# Patient Record
Sex: Female | Born: 1960 | Race: White | Hispanic: No | Marital: Married | Smoking: Never smoker
Health system: Southern US, Community
[De-identification: ages and names within clinical notes are randomized; demographics above are authoritative.]

## PROBLEM LIST (undated history)

## (undated) DIAGNOSIS — G500 Trigeminal neuralgia: Secondary | ICD-10-CM

## (undated) DIAGNOSIS — K859 Acute pancreatitis without necrosis or infection, unspecified: Secondary | ICD-10-CM

## (undated) DIAGNOSIS — R5382 Chronic fatigue, unspecified: Secondary | ICD-10-CM

## (undated) DIAGNOSIS — M797 Fibromyalgia: Secondary | ICD-10-CM

## (undated) HISTORY — PX: ABDOMINAL HYSTERECTOMY: SHX81

---

## 1997-08-07 ENCOUNTER — Emergency Department (HOSPITAL_COMMUNITY): Admission: EM | Admit: 1997-08-07 | Discharge: 1997-08-07 | Payer: Self-pay | Admitting: Emergency Medicine

## 1998-06-14 ENCOUNTER — Emergency Department (HOSPITAL_COMMUNITY): Admission: EM | Admit: 1998-06-14 | Discharge: 1998-06-14 | Payer: Self-pay | Admitting: Emergency Medicine

## 1998-06-20 ENCOUNTER — Encounter: Payer: Self-pay | Admitting: Emergency Medicine

## 1998-06-20 ENCOUNTER — Emergency Department (HOSPITAL_COMMUNITY): Admission: EM | Admit: 1998-06-20 | Discharge: 1998-06-20 | Payer: Self-pay | Admitting: Emergency Medicine

## 1998-06-21 ENCOUNTER — Emergency Department (HOSPITAL_COMMUNITY): Admission: EM | Admit: 1998-06-21 | Discharge: 1998-06-21 | Payer: Self-pay | Admitting: Emergency Medicine

## 1998-06-22 ENCOUNTER — Inpatient Hospital Stay (HOSPITAL_COMMUNITY): Admission: EM | Admit: 1998-06-22 | Discharge: 1998-06-24 | Payer: Self-pay | Admitting: *Deleted

## 1998-07-02 ENCOUNTER — Encounter: Admission: RE | Admit: 1998-07-02 | Discharge: 1998-07-02 | Payer: Self-pay | Admitting: Internal Medicine

## 1998-07-04 ENCOUNTER — Encounter: Admission: RE | Admit: 1998-07-04 | Discharge: 1998-07-04 | Payer: Self-pay | Admitting: Hematology and Oncology

## 1998-07-06 ENCOUNTER — Emergency Department (HOSPITAL_COMMUNITY): Admission: EM | Admit: 1998-07-06 | Discharge: 1998-07-06 | Payer: Self-pay | Admitting: Emergency Medicine

## 1998-07-11 ENCOUNTER — Encounter: Admission: RE | Admit: 1998-07-11 | Discharge: 1998-07-11 | Payer: Self-pay | Admitting: Internal Medicine

## 1998-07-18 ENCOUNTER — Encounter: Admission: RE | Admit: 1998-07-18 | Discharge: 1998-07-18 | Payer: Self-pay | Admitting: Internal Medicine

## 1998-08-15 ENCOUNTER — Encounter: Admission: RE | Admit: 1998-08-15 | Discharge: 1998-08-15 | Payer: Self-pay | Admitting: Internal Medicine

## 1998-10-27 ENCOUNTER — Emergency Department (HOSPITAL_COMMUNITY): Admission: EM | Admit: 1998-10-27 | Discharge: 1998-10-27 | Payer: Self-pay | Admitting: Emergency Medicine

## 1998-10-27 ENCOUNTER — Encounter: Payer: Self-pay | Admitting: Emergency Medicine

## 1998-10-28 ENCOUNTER — Encounter: Payer: Self-pay | Admitting: Emergency Medicine

## 1998-10-28 ENCOUNTER — Emergency Department (HOSPITAL_COMMUNITY): Admission: EM | Admit: 1998-10-28 | Discharge: 1998-10-28 | Payer: Self-pay | Admitting: Emergency Medicine

## 1998-10-28 ENCOUNTER — Ambulatory Visit (HOSPITAL_COMMUNITY): Admission: RE | Admit: 1998-10-28 | Discharge: 1998-10-28 | Payer: Self-pay | Admitting: Emergency Medicine

## 1998-11-25 ENCOUNTER — Encounter: Payer: Self-pay | Admitting: Gastroenterology

## 1998-11-25 ENCOUNTER — Inpatient Hospital Stay (HOSPITAL_COMMUNITY): Admission: EM | Admit: 1998-11-25 | Discharge: 1998-11-26 | Payer: Self-pay | Admitting: Gastroenterology

## 1998-12-03 ENCOUNTER — Encounter: Admission: RE | Admit: 1998-12-03 | Discharge: 1998-12-03 | Payer: Self-pay | Admitting: Hematology and Oncology

## 1998-12-06 ENCOUNTER — Encounter: Admission: RE | Admit: 1998-12-06 | Discharge: 1998-12-06 | Payer: Self-pay | Admitting: Internal Medicine

## 2003-09-24 ENCOUNTER — Emergency Department (HOSPITAL_COMMUNITY): Admission: EM | Admit: 2003-09-24 | Discharge: 2003-09-24 | Payer: Self-pay | Admitting: Family Medicine

## 2004-08-28 ENCOUNTER — Emergency Department (HOSPITAL_COMMUNITY): Admission: EM | Admit: 2004-08-28 | Discharge: 2004-08-28 | Payer: Self-pay | Admitting: Emergency Medicine

## 2004-09-13 ENCOUNTER — Inpatient Hospital Stay (HOSPITAL_COMMUNITY): Admission: EM | Admit: 2004-09-13 | Discharge: 2004-09-17 | Payer: Self-pay | Admitting: Emergency Medicine

## 2004-09-13 ENCOUNTER — Ambulatory Visit: Payer: Self-pay | Admitting: Internal Medicine

## 2004-11-25 ENCOUNTER — Ambulatory Visit: Payer: Self-pay | Admitting: Pain Medicine

## 2005-03-03 ENCOUNTER — Inpatient Hospital Stay (HOSPITAL_COMMUNITY): Admission: EM | Admit: 2005-03-03 | Discharge: 2005-03-04 | Payer: Self-pay | Admitting: *Deleted

## 2005-08-17 ENCOUNTER — Ambulatory Visit: Payer: Self-pay | Admitting: Hospitalist

## 2005-08-17 ENCOUNTER — Inpatient Hospital Stay (HOSPITAL_COMMUNITY): Admission: EM | Admit: 2005-08-17 | Discharge: 2005-08-19 | Payer: Self-pay | Admitting: Emergency Medicine

## 2005-08-17 ENCOUNTER — Ambulatory Visit: Payer: Self-pay | Admitting: Internal Medicine

## 2006-02-09 ENCOUNTER — Emergency Department: Payer: Self-pay | Admitting: Emergency Medicine

## 2006-10-05 ENCOUNTER — Other Ambulatory Visit: Payer: Self-pay

## 2006-10-05 ENCOUNTER — Emergency Department: Payer: Self-pay | Admitting: Emergency Medicine

## 2007-11-20 ENCOUNTER — Emergency Department (HOSPITAL_COMMUNITY): Admission: EM | Admit: 2007-11-20 | Discharge: 2007-11-21 | Payer: Self-pay | Admitting: Emergency Medicine

## 2008-01-05 ENCOUNTER — Emergency Department (HOSPITAL_COMMUNITY): Admission: EM | Admit: 2008-01-05 | Discharge: 2008-01-05 | Payer: Self-pay | Admitting: Family Medicine

## 2008-01-17 ENCOUNTER — Emergency Department (HOSPITAL_COMMUNITY): Admission: EM | Admit: 2008-01-17 | Discharge: 2008-01-17 | Payer: Self-pay | Admitting: Family Medicine

## 2008-09-26 ENCOUNTER — Inpatient Hospital Stay (HOSPITAL_COMMUNITY): Admission: EM | Admit: 2008-09-26 | Discharge: 2008-09-28 | Payer: Self-pay | Admitting: Emergency Medicine

## 2008-09-26 ENCOUNTER — Ambulatory Visit: Payer: Self-pay | Admitting: Family Medicine

## 2010-01-28 ENCOUNTER — Emergency Department (HOSPITAL_COMMUNITY): Admission: EM | Admit: 2010-01-28 | Discharge: 2010-01-28 | Payer: Self-pay | Admitting: Emergency Medicine

## 2010-06-02 IMAGING — US US ABDOMEN COMPLETE
1 series · 14 of 25 positions shown · non-contrast
Comparison: Ultrasound of the abdomen of 11/21/2007

CLINICAL DATA: Abdominal pain, vomiting, history of pancreatitis
previously

COMPLETE ABDOMINAL ULTRASOUND

[Series 1: us abdomen complete · 0.25mm/px · 14 of 60 slices shown]
[im 1/60]
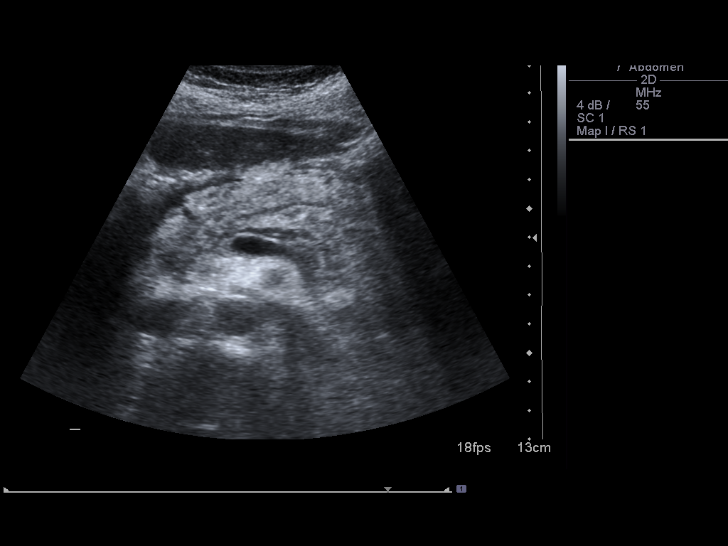
[im 5/60]
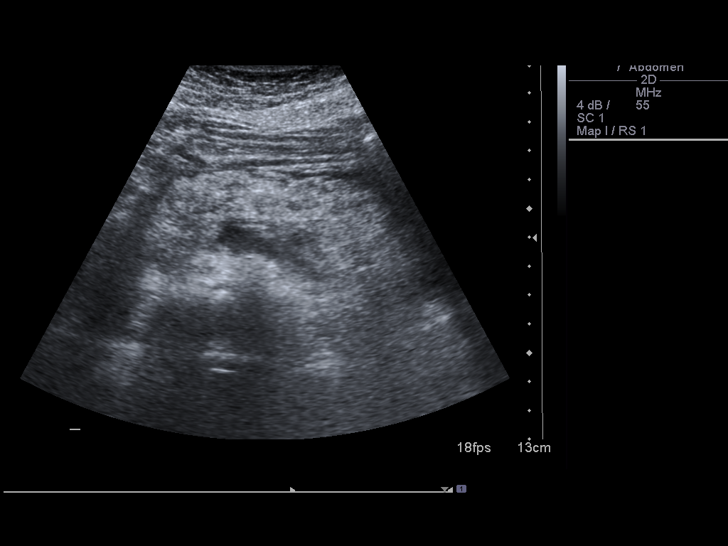
[im 10/60]
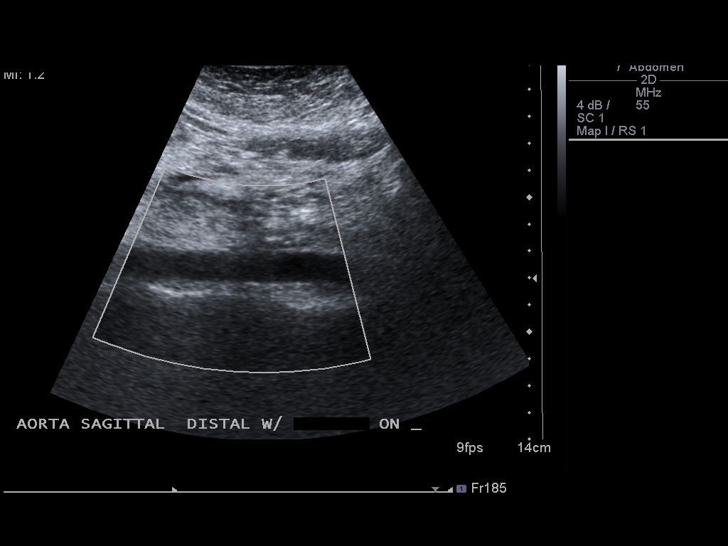
[im 15/60]
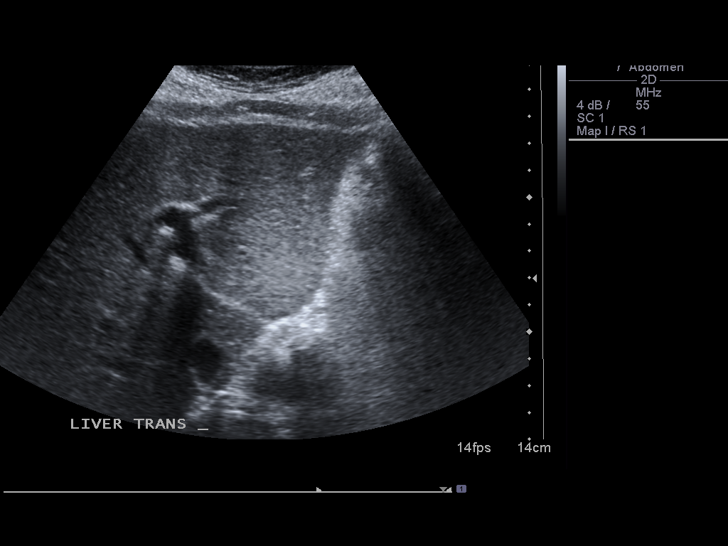
[im 20/60]
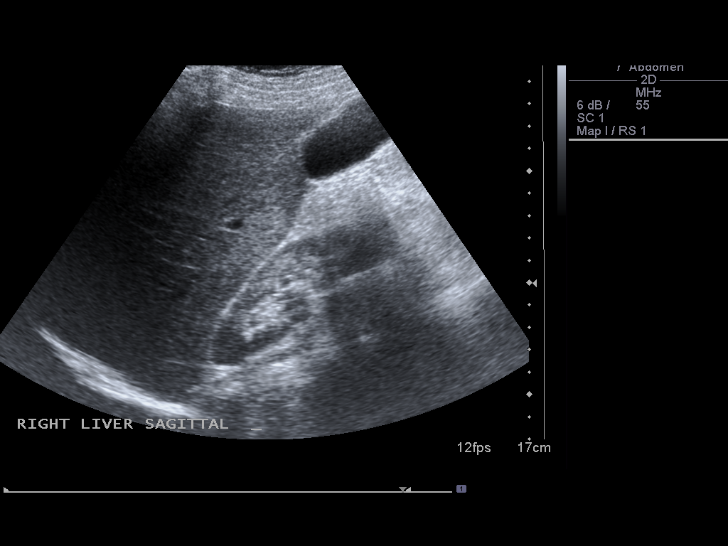
[im 23/60]
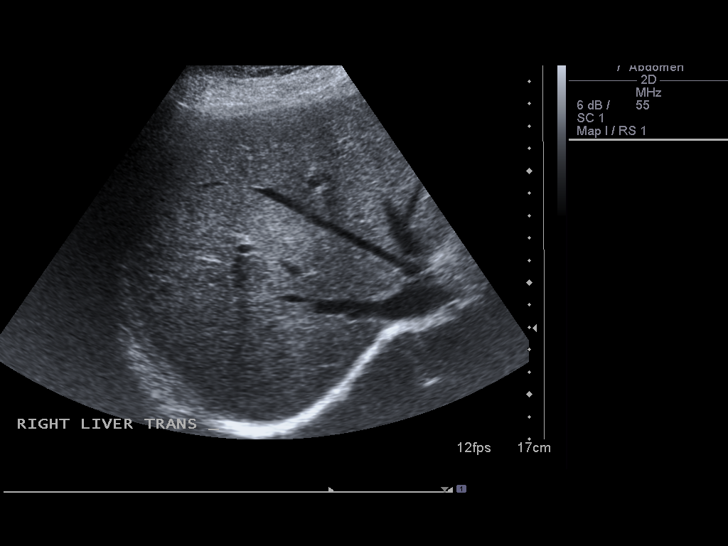
[im 28/60]
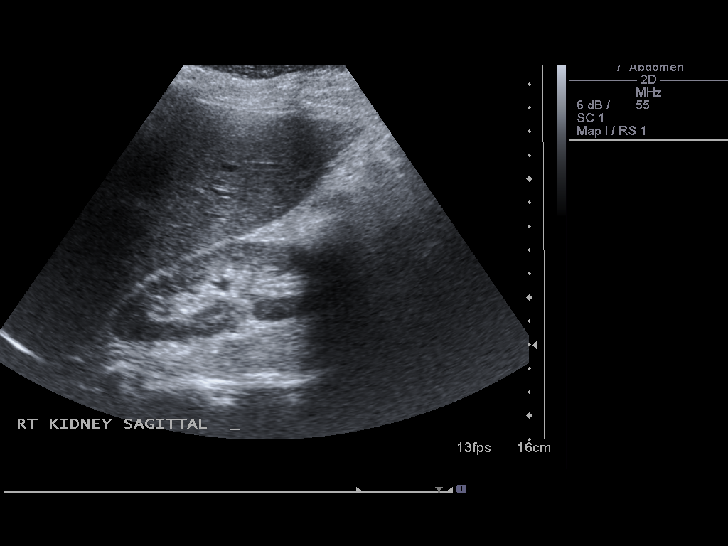
[im 32/60]
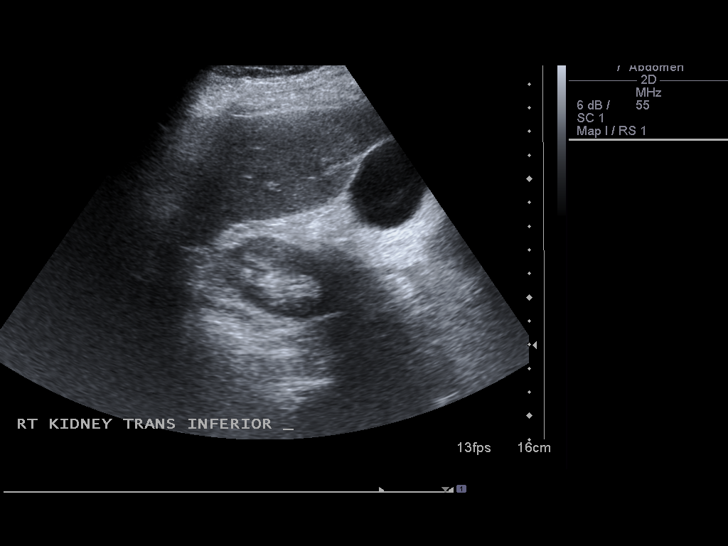
[im 37/60]
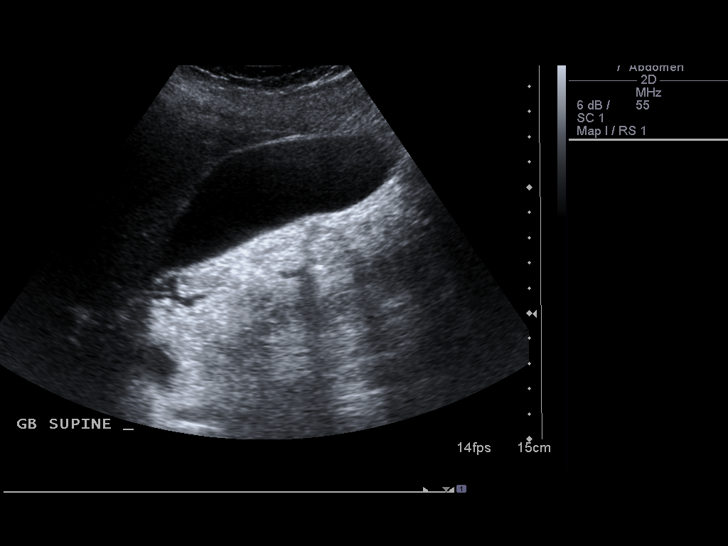
[im 40/60]
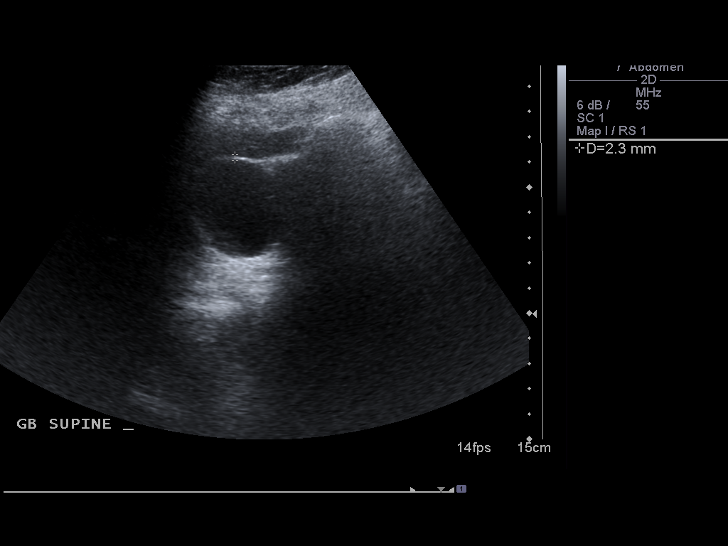
[im 45/60]
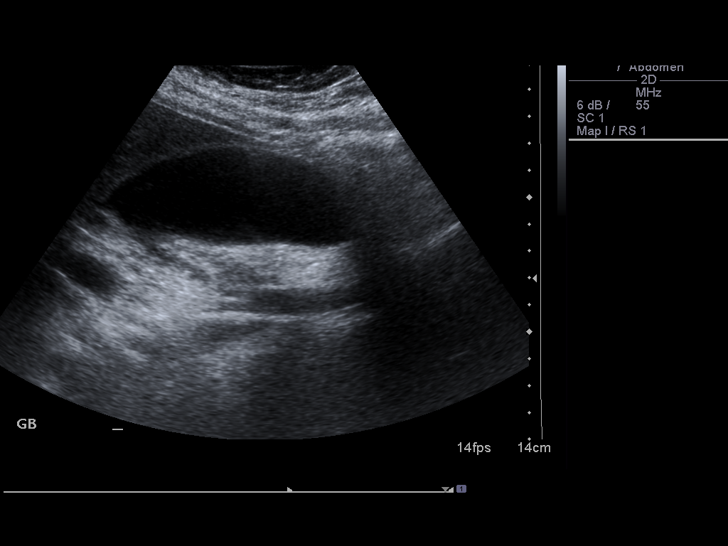
[im 50/60]
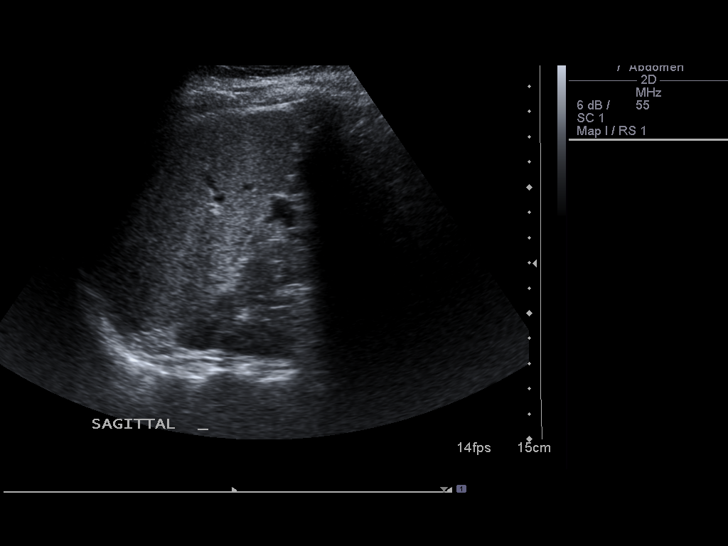
[im 55/60]
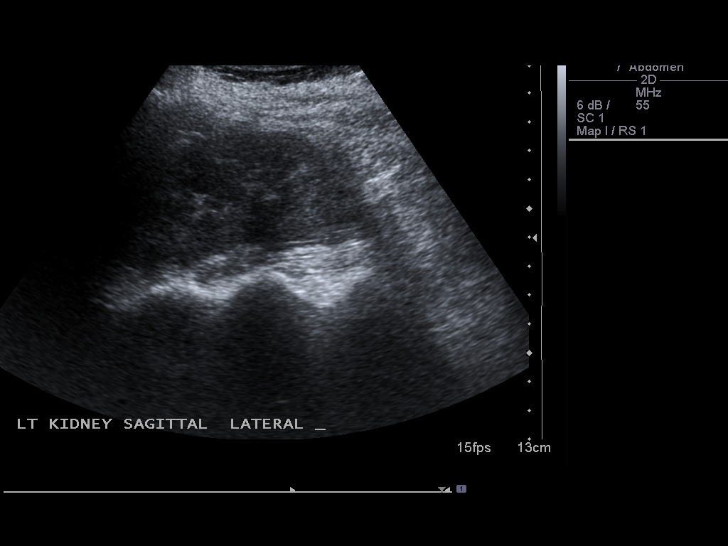
[im 60/60]
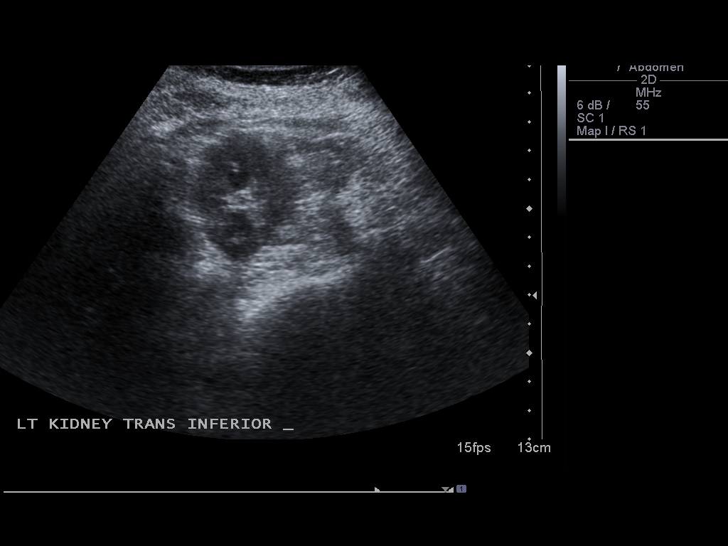

[14 of 25 positions shown; findings below may reference images not displayed]

FINDINGS: Gallbladder:  The gallbladder is well seen and no gallstones are
noted.  No pain is present over the gallbladder upon compression.

Common bile duct:  The common bile duct is normal measuring 4.0 mm
in diameter.

Liver:  The liver has a normal echogenic pattern.  No ductal
dilatation is seen.

IVC:  Visualized.

Pancreas:  The pancreas is diffusely prominent and somewhat
echogenic.  There is pain over the region of the pancreas and early
pancreatitis is a consideration.  No ductal dilatation is seen.

Spleen:  The spleen is normal in size.

Right Kidney:  No hydronephrosis is noted.  The right kidney
measures 10.1 cm sagittally.

Left Kidney:  No hydronephrosis and the left kidney measures
cm.

Abdominal aorta:  The abdominal aorta is normal in caliber.
IMPRESSION: 1.  Prominent echogenic and inhomogeneous pancreas with pain
directly over the pancreas.  Possible early pancreatitis.
2.  No gallstones.  No ductal dilatation.

## 2010-07-28 LAB — CBC
HCT: 32.7 % — ABNORMAL LOW (ref 36.0–46.0)
HCT: 42.4 % (ref 36.0–46.0)
Hemoglobin: 11.3 g/dL — ABNORMAL LOW (ref 12.0–15.0)
MCHC: 34.4 g/dL (ref 30.0–36.0)
Platelets: 219 10*3/uL (ref 150–400)
Platelets: 340 10*3/uL (ref 150–400)
RBC: 3.68 MIL/uL — ABNORMAL LOW (ref 3.87–5.11)
RDW: 12.2 % (ref 11.5–15.5)
WBC: 10.9 10*3/uL — ABNORMAL HIGH (ref 4.0–10.5)
WBC: 6.4 10*3/uL (ref 4.0–10.5)

## 2010-07-28 LAB — COMPREHENSIVE METABOLIC PANEL
BUN: 10 mg/dL (ref 6–23)
Chloride: 104 mEq/L (ref 96–112)
Creatinine, Ser: 0.82 mg/dL (ref 0.4–1.2)
Glucose, Bld: 124 mg/dL — ABNORMAL HIGH (ref 70–99)
Potassium: 3.8 mEq/L (ref 3.5–5.1)
Sodium: 140 mEq/L (ref 135–145)
Total Bilirubin: 0.9 mg/dL (ref 0.3–1.2)
Total Protein: 7.4 g/dL (ref 6.0–8.3)

## 2010-07-28 LAB — BASIC METABOLIC PANEL
BUN: 4 mg/dL — ABNORMAL LOW (ref 6–23)
CO2: 26 mEq/L (ref 19–32)
CO2: 26 mEq/L (ref 19–32)
Calcium: 8.5 mg/dL (ref 8.4–10.5)
Chloride: 106 mEq/L (ref 96–112)
Chloride: 110 mEq/L (ref 96–112)
Creatinine, Ser: 0.73 mg/dL (ref 0.4–1.2)
GFR calc non Af Amer: >60 mL/min (ref >60)
Glucose, Bld: 120 mg/dL — ABNORMAL HIGH (ref 70–99)
Potassium: 3.5 mEq/L (ref 3.5–5.1)
Potassium: 4 mEq/L (ref 3.5–5.1)
Sodium: 141 mEq/L (ref 135–145)

## 2010-07-28 LAB — DIFFERENTIAL
Lymphocytes Relative: 18 % (ref 12–46)
Lymphs Abs: 1.8 10*3/uL (ref 0.7–4.0)
Neutro Abs: 7.6 10*3/uL (ref 1.7–7.7)
Neutrophils Relative %: 74 % (ref 43–77)

## 2010-07-28 LAB — IGG 1, 2, 3, AND 4: IgG Subclass 1: 423 mg/dL (ref 382–929)

## 2010-07-28 LAB — AMYLASE
Amylase: 1647 U/L — ABNORMAL HIGH (ref 27–131)
Amylase: 398 U/L — ABNORMAL HIGH (ref 27–131)

## 2010-07-28 LAB — LIPID PANEL
HDL: 27 mg/dL — ABNORMAL LOW (ref >39)
Total CHOL/HDL Ratio: 8.2 RATIO
Triglycerides: 209 mg/dL — ABNORMAL HIGH (ref <150)
VLDL: 42 mg/dL — ABNORMAL HIGH (ref 0–40)

## 2010-07-28 LAB — LIPASE, BLOOD: Lipase: 628 U/L — ABNORMAL HIGH (ref 11–59)

## 2010-09-02 NOTE — Consult Note (Signed)
NAMEMarland Kitchen  Bartlett, Mia NO.:  [id number]   MEDICAL RECORD NO.:  [id number]          PATIENT TYPE:  INP   LOCATION:  5502                         FACILITY:  [facility]   PHYSICIAN:  Jordan Hawks. Elnoria Howard, MD    DATE OF BIRTH:  November 02, 1960   DATE OF CONSULTATION:  09/27/2008  DATE OF DISCHARGE:                                 CONSULTATION   REFERRED BY:  Teaching Service.   REASON FOR CONSULTATION:  Pancreatitis.   This is an unassigned patient from the Teaching Service.   HISTORY OF PRESENT ILLNESS:  This is a 50 year old female with a past  medical history of acute recurrent pancreatitis, trigeminal neuralgia,  depression, fibromyalgia, peripheral neuropathy, TMJ, and chronic  fatigue, was admitted to the hospital with an acute onset of her  abdominal pain.  The patient states that the pain started without any  provocative factors that is with p.o. intake.  It was quite severe to  the point that she required an evaluation in the emergency room.  When  she presented, she was noted to have marked elevation in her amylase and  lipase and subsequently, she was admitted to the hospital.  Her last  hospitalization for pancreatitis was in November 2006, at that time she  underwent a workup, which was negative for any overt etiology that is  pancreatic divisum, strictures, masses, or stones.  During this  hospitalization, ultrasound of her gallbladder was performed, and there  is no evidence of any biliary ductal dilation or stones or sludge in her  gallbladder.  Subsequently, she does report having a mild episode in  March and at that time, she had some questionable GERD type of symptoms  and subsequently, she presented to the [facility] Emergency Room, but  no hospitalization occurred at that time and she said no obvious  etiology was performed.  She was unsure that that was a subclinical  presentation of her pancreatitis.   PAST MEDICAL HISTORY AND PAST SURGICAL HISTORY:  As  stated above.  Status post hysterectomy and C-sections x3.   FAMILY HISTORY:  Noncontributory.   SOCIAL HISTORY:  Negative for tobacco or illicit drug use.  She uses  alcohol on rare occasions.   REVIEW OF SYSTEMS:  As stated above in history of present illness,  otherwise negative.   MEDICATIONS:  1. Subcu heparin.  2. Protonix 40 mg IV daily.  3. Zofran 4 mg IV q.6 h.  4. Phenergan 25 mg IV q.6 h.  5. Morphine PCA.   ALLERGIES:  DOXYCYCLINE.   PHYSICAL EXAMINATION:  VITAL SIGNS:  Blood pressure is 132/85, heart  rate is 84, respirations 20, and temperature is 100.4.  GENERAL:  The patient is in no acute distress, alert, and oriented.  HEENT:  Normocephalic and atraumatic.  Extraocular muscles intact.  NECK:  Supple with no lymphadenopathy.  LUNGS:  Clear to auscultation bilaterally.  CARDIOVASCULAR:  Regular rate and rhythm.  ABDOMEN:  Flat, soft, and nontender in the epigastric region.  No  rebound or rigidity.  Positive bowel sounds.  EXTREMITIES:  No clubbing, cyanosis, or edema.   LABORATORY  VALUES:  White blood cell count is 10.9, hemoglobin 12.7, MCV  is 89.7, and platelets at 253.  Sodium 138, potassium 4.0, chloride 106,  CO2 of 26, glucose 120, BUN 4, and creatinine 0.7.  Amylase is 1647 and  lipase is 1462.   IMPRESSION:  1. Acute recurrent pancreatitis of unknown etiology.  As previously      stated, extensive workup was performed.  There is no clear evidence      of any etiology.  I do not believe she has chronic pancreatitis as      she does not have any persistent chronic abdominal pain beyond      these flares and she has not had a pancreatic issues for almost 3-      1/2 years.  It is possible that she could have microlithiasis      resulting in gallstone pancreatitis or possibility of sphincter of      Oddi dysfunction. What will be required is an outpatient endoscopic      ultrasound in order to further evaluate the pancreatic parenchyma      to  ensure that there was no evidence of chronic pancreatitis.  This      procedure can only be performed at a minimum of 6 weeks as any type      of acute inflammation will skew the results, and at that time      microlithiasis can also be evaluated.  The patient may require      further evaluation with sphincter of Oddi dysfunction and that can      be performed at [facility].  As of this time, I agree with IV      fluids, although dose should be increased to 200 mL per hour.  2. Preferably, she should remain n.p.o. as there is an elevation in      her amylase and lipase, however, she feels that her pain is      markedly improved at this time, and this issue can be watched  3. Check for autoimmune pancreatitis with an IgG4.      Jordan Hawks Elnoria Howard, MD  Electronically Signed     PDH/MEDQ  D:  09/27/2008  T:  09/28/2008  Job:  [id number]

## 2010-09-02 NOTE — Discharge Summary (Signed)
NAMEMarland Bartlett  [name], [name] NO.:  [id number]   MEDICAL RECORD NO.:  [id number]          PATIENT TYPE:  INP   LOCATION:  5502                         FACILITY:  [facility]   PHYSICIAN:  Leighton Roach McDiarmid, M.D.DATE OF BIRTH:  [birth year]   DATE OF ADMISSION:  09/26/2008  DATE OF DISCHARGE:  09/28/2008                               DISCHARGE SUMMARY   PRIMARY CARE PHYSICIAN:  Josefina Do. Mia Guest, MD   DISCHARGE DIAGNOSES:  1. Acute idiopathic recurrent pancreatitis.  2. Trigeminal neuralgia.  3. Depression.  4. Fibromyalgia.  5. Peripheral neuropathy.  6. Temporomandibular joint disorder.  7. Chronic fatigue.   DISCHARGE MEDICATIONS:  1. Percocet 5/325 one to two tablets every 4-6 hours as needed for      pain.  2. Estradiol as directed by primary care physician.   DISCONTINUED MEDICATIONS:  The patient stated she had been on Cymbalta  and Lyrica previously but had recently stopped them prior to  presentation on the emergency department.   CONSULTANT:  Jordan Hawks. Elnoria Howard, MD, Gastroenterology   PROCEDURES:  Abdominal ultrasound on September 26, 2008 shows a prominent  echogenic and inhomogeneous pancreas with pain directly over the  pancreas.  Possible early pancreatitis.  No gallstones or ductal  dilatation seen.   LABORATORY STUDIES:  1. CBC on admission showed white blood count 10.2, hemoglobin 14.6,      and platelets 340.  2. Metabolic panel on admission showed:  Sodium 140, potassium 3.8,      chloride 104, bicarb 26, glucose 124, BUN 10, and creatinine 0.82.      Alkaline phosphatase 122, AST 23, ALT 14, total protein 7.4,      albumin 4.0, and calcium 9.6.  3. Lipase 628 on admission and 1462 on day after admission.  4. Amylase 398 on admission and 1647 on day after admission.  5. Lipid profile shows total cholesterol 222, triglycerides 209, HDL      27, and LDL 153.   BRIEF HOSPITAL COURSE:  This is a 50 year old female with a past medical  history significant  for recurrent acute episodes of pancreatitis who  presented to the ED with abdominal pain and nonbloody, nonbilious emesis  x1 day.  1. Acute pancreatitis.  The patient has a Ranson score of zero on      admission.  Physical exam, laboratory studies were consistent with      acute pancreatitis.  The patient has had several previous      admissions for pancreatitis.  Her last one being in 2006, during      which time extensive workup showed no etiology for pancreatitis.      Ultrasound was repeated on this hospitalization which showed no      gallbladder pathology and pancreatic changes consistent with      pancreatitis.  Dr. Elnoria Howard was consulted who did not feel that her      history was suggestive of chronic pancreatitis.  He suggested that      she follow up in 6 weeks after this acute episode had resolved to      further evaluate pancreatic parenchyma  by endoscopic ultrasound and      possible further evaluation for sphincter of Oddi dysfunction.      Labs were drawn for autoimmune pancreatitis.  At time of discharge,      IgG4 was still pending.   The patient was admitted on supportive therapy with IV fluids at 200  mL/hour, n.p.o.  The patient was switched to a morphine PCA for better  pain control.  The patient tolerated clears well and the next day  quickly advanced to a low-fat regular diet with good pain control on  p.o. Percocet.  1. Depression/fibromyalgia/peripheral neuropathy.  The patient states      she is not currently on medications for any of these conditions.      She showed no acute changes and was asked to follow up with her PCP      for further needs.   DISCHARGE INSTRUCTIONS:  The patient was instructed to advance diet  slowly as tolerated other than cautions to observe a low-fat diet.   FOLLOWUP:  The patient will follow up in 1-2 weeks with Dr. Jorene Bartlett and  was given information to follow with Dr. Elnoria Howard in 6 weeks.      Delbert Harness, MD  Electronically  Signed      Leighton Roach McDiarmid, M.D.  Electronically Signed    KB/MEDQ  D:  09/28/2008  T:  09/29/2008  Job:  [id number]   cc:   Josefina Do. Mia Bartlett, M.D.

## 2010-09-02 NOTE — H&P (Signed)
NAMEMarland Kitchen  Mia Bartlett, Mia Bartlett NO.:  [id number]   MEDICAL RECORD NO.:  [id number]          PATIENT TYPE:  INP   LOCATION:  5502                         FACILITY:  [facility]   PHYSICIAN:  Leighton Roach McDiarmid, M.D.DATE OF BIRTH:  [birth year]   DATE OF ADMISSION:  09/26/2008  DATE OF DISCHARGE:                              HISTORY & PHYSICAL   PRIMARY CARE PHYSICIAN:  Dr. Jorene Guest in [place].   CHIEF COMPLAINT:  Pancreatitis.   HISTORY OF PRESENT ILLNESS:  This is a 50 year old female with a history  of recurrent and questionable chronic pancreatitis here with abdominal  pain.  She has severe pain starting this morning.  She has vomited  several times today.  There is no blood in her vomit.  Her pain is in  the epigastric area and not relieved by fentanyl or Dilaudid that she  has received in the emergency room.  Morphine has helped in the past.  She denies fevers and dysuria.  She had beem feeling well until this  morning except for some increased indigestion.  In the past, there has  been no known etiology for pancreatitis.  However, she did not follow up  with Dr. Elnoria Howard, our GI doctor, for further workup.  The pain is currently  burning pain.   PAST MEDICAL HISTORY:  1. Chronic pancreatitis.  2. Trigeminal neuralgia.  3. Depression.  4. Fibromyalgia.  5. Peripheral neuropathy.  6. TMJ.  7. Chronic fatigue.   MEDICATIONS:  1. Estradiol unknown dose.  2. Percocet 10/650 four to five tablets daily.  She recently stopped Cymbalta and Lyrica.  In the emergency room, she has received 2 mg of Dilaudid, as well as 100  mcg of fentanyl.   ALLERGIES:  DOXYCYCLINE causes rash.   SOCIAL HISTORY:  She denies tobacco, denies drugs.  She uses alcohol on  rare occasions.  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   FAMILY HISTORY:  Hypertension and diabetes in her mother.  Father had MI  at 92.  There is no known GI or pancreatitis in her family.   PAST SURGICAL HISTORY:   Hysterectomy and 3 C-sections.   REVIEW OF SYSTEMS:  As in the HPI, as well as denies dysuria, denies  shortness of breath, denies chest pain, denies diarrhea, denies  vomiting, denies hematochezia, denies edema.   PHYSICAL EXAMINATION:  VITAL SIGNS:  Currently, temperature is 97.3,  heart rate 77, respiratory rate 22, blood pressure 149/87, O2 sats are  100 on room air.  GENERAL:  Not in acute distress.  She is holding her epigastric area.  HEENT:  Moist mucous membranes.  Poor dentition.  No erythema in throat.  Pupils equal, round, and reactive to light and accommodation.  Extraocular muscles are intact.  There are no lesions in her nares.  NECK:  No masses.  CARDIOVASCULAR:  Regular rate and rhythm.  No rubs, gallops, or murmurs.  Normal radial and DP pulses.  PULMONARY:  Clear to auscultation bilaterally.  ABDOMEN:  Soft, diffuse tenderness to palpation in the mid and upper  areas, worse in epigastric area.  She has right  upper quadrant  tenderness to palpation and positive McBurney sign.  She has guarding,  however, no rebound.  EXTREMITIES:  Full range of movement.  No edema.  SKIN:  She has got dry skin on the plantar surface of her feet.  No  sores.  She has a brisk cap refill.  NEUROLOGIC:  Oriented x3.  She has got normal deep tendon reflexes that  equal and no clonus.   LABORATORY DATA:  White count 10.2, hemoglobin 14.6, hematocrit 42.4,  platelets 340, neutrophil count of 7.6.  Lipase is elevated at 628.  Sodium 140, potassium 3.8, chloride 104, bicarb 26, BUN 10, creatinine  0.86, glucose 124.  Bilirubin 0.9, alk phos is elevated at 122, AST 23,  ALT 14, total protein 7.4, calcium 9.6, albumin 4.   ASSESSMENT:  A 50 year old female with pancreatitis, acute and  questionable on chronic.  1. Pancreatitis:  Mild with Ranson score of 0 currently, which      indicates good prognosis and low mortality.  We need to check her      hematocrit, BUN, bicarb, calcium, and  fluids to calculate her total      score in 48 hours.  Her lipase is currently elevated compared to      her recent visits, however, not as high as it has been in the past.      Her baseline lipase maybe elevated as in her history she has a      chronic pancreatitis.  We are going to obtain an abdominal      ultrasound to assess for gallbladder disease and to evaluate her      pancreas.  If there are signs of cirrhosis or worsening clinical      status, we will obtain a CT of the abdomen.  We will go ahead and      check an amylase now.  Repeat her labs in the morning.  We will      also add on a fasting lipid panel to assess for elevated      triglycerides.  In rare cases, estrogen can cause pancreatitis, so      we will discontinue this at this point.  In the past, there is no      known etiology for her pancreatitis and this may be the case again.      Consider checking an HIV.  Alcohol is does not appear to be the      cause.  2. Pain:  In the past, she has responded to morphine.  She is willing      to try p.r.n. IV meds at first; however, she may benefit from a PCA      pump in the future.  At that point, we will need to calculate her      basal based on her advanced p.r.n. need.  3. Fluids:  Needs fluid resuscitation.  She was given 1 liter in the      emergency room, and I will now bolus her 1 more liter and then put      her on a D5 normal saline at 200 mL/hour.  We can increase this if      needed.  4. Gastrointestinal:  N.p.o. due to pain.  Once decreased pain, she      can eat as tolerated since she has no infection and is not severely      ill.  5. Depression:  Not on meds.  6. Trigeminal neurologia:  Not  on meds, although recently stopped      Cymbalta and Lyrica.  7. Prophylaxis:  PPI and heparin.   DISPOSITION:  She needs pain management and fluid resuscitation likely  in 1-2 days if she improves although longer if she deteriorates.      Johney Maine, M.D.   Electronically Signed      Leighton Roach McDiarmid, M.D.  Electronically Signed    JT/MEDQ  D:  09/26/2008  T:  09/27/2008  Job:  [id number]

## 2010-09-05 NOTE — Discharge Summary (Signed)
NAMEMarland Bartlett  MADEEHA, COSTANTINO NO.:  [id number]   MEDICAL RECORD NO.:  [id number]          PATIENT TYPE:  INP   LOCATION:  5712                         FACILITY:  [facility]   PHYSICIAN:  Hillery Aldo, M.D.   DATE OF BIRTH:  [birth year]   DATE OF ADMISSION:  09/13/2004  DATE OF DISCHARGE:  09/17/2004                                 DISCHARGE SUMMARY   DISCHARGE DIAGNOSES:  1. Acute pancreatitis of unknown etiology.  2. Sinusitis.  3. Anemia.  4. Diffuse body aches.  5. Hysterectomy with C-section.  6. Trigeminal neuralgia.  7. History of shingles.     DISCHARGE MEDICATIONS:  1. Percocet 5/325 mg q.6h. p.r.n. for pain.  2. Phenergan 25 mg q.6h. p.r.n. for nausea.  3. Neurontin 600 mg t.i.d.  4. Pancrease.     CONSULTATIONS DURING HOSPITALIZATION:  GI consult from Dr. Elnoria Howard.   DISPOSITION:  The patient is to be discharged home.  She is to follow up  with her PCP, Dr. Vear Clock in [place], [place].   BRIEF ADMISSION HISTORY AND PHYSICAL:  Ms. Mia Bartlett is a 50 year old white  female with a past medical history significant for multiple bouts of  pancreatitis with no known etiology.  The patient was last hospitalized in  2000 with negative workup.  The patient awoke the night before admission  with sharp abdominal pain, epigastric and radiating in a band around her  right upper quadrant to the back.  She also had associated chest pain that  worsened with movement, deep breathing, and mild shortness of breath.  She  related two episodes of nonbilious, nonbloody emesis.  She also stated that  the pain felt like her pancreatitis flares in the past.  She denied any  fevers, chills, or melena.  She had recently started on Tegretol for  trigeminal neuralgia.   PHYSICAL EXAMINATION ON ADMISSION:  VITAL SIGNS:  Pulse 65, blood pressure  104/61, temperature 98.6, respirations 18, O2 saturation 99% on room air.  GENERAL:  She appeared in mild discomfort, alert and  oriented x3.  HEENT:  Eyes were PERRLA, no icterus, extraocular movements intact.  Her  oropharynx was clear.  NECK:  Soft, supple, no thyromegaly, no lymphadenopathy.  LUNGS:  Clear to auscultation bilaterally.  She had a regular rate and  rhythm, no murmurs, rubs, or gallops.  ABDOMEN:  Soft with moderate epigastric tenderness and right upper quadrant  tenderness.  She had some involuntary guarding, no rebound, no  hepatosplenomegaly.  EXTREMITIES:  Showed no clubbing, cyanosis, or edema.  SKIN:  No rash.  She had no lymphadenopathy.  MUSCULOSKELETAL:  Grossly intact.  She had no focal neurological deficits.   STUDIES:  EKG showed no ischemia.  Acute abdominal series showed no acute  disease in the chest, normal bowel gas pattern.   LABORATORY DATA:  Sodium 136, potassium 4.2, chloride 104, bicarb 27, BUN  11, creatinine 0.9, glucose 101.  Anion gap was 5, bilirubin 0.5, alkaline  phosphatase 54.  SGOT 19, SGPT 18.  Protein 6.3, albumin 3.6, calcium 8.5,  lipase 731, amylase 721.  White count was 13.9, hemoglobin 11.9,  platelets  373,000.  ANC was 11.7.   PROCEDURES PERFORMED DURING HOSPITALIZATION:  (1)  Ultrasound of the abdomen  showed a small amount of fluid identified around the tail of the pancreas  and spleen of indeterminate etiology.  There is mild right-sided  hydronephrosis.  There is no evidence of cholelithiasis or biliary duct  dilatation.  (2) CT of the abdomen and pelvis demonstrated acute  pancreatitis with enlarged pancreatic tail with extensive peripancreatic  fluid tracking the medial aspect of the spleen extending along the lateral  __________ .  The pelvis demonstrated minimal free peritoneal fluid, absence  of the uterus and ovaries, no adnexal masses, minimal free peritoneal fluid.  Plain films of the spine showed some disk space narrowing at L5-S1.   HOSPITAL COURSE:  Problem 1. Pancreatitis.  The patient was admitted to a  regular floor bed for  monitoring, was started on IV morphine and eventually  a PCA pump with morphine for pain control.  Was given Zofran for nausea as  needed and also made NPO.  On the second day of admission, the patient was  transitioned to liquids, was tolerating this well, and on the third day of  admission was advanced to a BRAT diet, and on the day of discharge was  tolerating a BRAT diet.  In GI consultation, Dr. Elnoria Howard stated the patient was  appropriate for discharge and recommended to discharge the patient with  Percocet 5/325 mg as needed for pain, to give the patient a trial of non-  enteric coated pancreatic lipase, recommended a followup MRCP as an  outpatient, and also to consider the patient for a cholecystectomy as a  possible source of the pancreatitis.  Might be due to idiopathic  microlithiasis from the gallbladder.  If the pancreatitis recurs in the  absence of a gallbladder, an ERCP with sphincterotomy might be considered.  A fasting lipid profile was checked for possible etiology.  Total  cholesterol was 174, LDL 108, triglycerides 164, HDL 33.  The patient denies  heavy alcohol consumption and there is no evidence of gallstones on any  imaging.   Problem 2. Anemia.  Stable hemoglobin, likely chronic.  No workup was done  since the patient's hemoglobin remained stable around 11.8.   Problem 3. Sinusitis.  The patient was started on Allegra and Afrin and will  be discharged with those.   Problem 4. Diffuse body aches, trigeminal neuralgia.  The patient will be  discontinued on Neurontin 600 mg.  The patient wishes to be worked up for MS  and recommended that she follow up with her PCP for an outpatient MRI.   LABORATORY DATA ON DISCHARGE:  White count 4.3, hemoglobin 11.8, platelets  326,000.  Sodium 139, potassium 4.0, chloride 106, bicarb 28, BUN 3,  creatinine 0.8, glucose 98.      SD/MEDQ  D:  09/17/2004  T:  09/17/2004  Job:  [id number]   cc:   Loma Sender [id number]   [place]   [address]  Fax: [phone]

## 2010-09-05 NOTE — Discharge Summary (Signed)
NAMEMarland Bartlett  MAHLANI, BERNINGER NO.:  [id number]   MEDICAL RECORD NO.:  [id number]          PATIENT TYPE:  INP   LOCATION:  6703                         FACILITY:  [facility]   PHYSICIAN:  Eliseo Gum, M.D.   DATE OF BIRTH:  1961/03/12   DATE OF ADMISSION:  08/17/2005  DATE OF DISCHARGE:  08/19/2005                                 DISCHARGE SUMMARY   DISCHARGE DIAGNOSES:  1.  Acute recurrent pancreatitis, cause unknown.  2.  Chronic pancreatitis.  3.  Trigeminal neuralgia.  4.  Depression.  5.  Fibromyalgia.  6.  Peripheral neuropathy.   DISCHARGE MEDICATIONS:  1.  Neurontin 1200 mg p.o. b.i.d.  2.  Percocet one to two tablet p.o. q.4h. p.r.n. pain, and I dispensed #40.   CONDITION:  Stable.  She was able to tolerate full liquid diet on discharge  and was ready to go home.  She did have a little bit of abdominal pain but  she is instructed to follow up with her primary care doctor and a GI doctor  within the next week or two.   PROCEDURES:  On August 17, 2005, she had a CT of her abdomen and pelvis  showing extensive peripancreatic fluid compatible with acute pancreatitis  but no pseudocyst and a small amount of pelvic ascites.  On Aug 18, 2005, she  had an abdominal ultrasound showing no evidence of gallstones or acute  cholecystitis, enlarged heterogeneous pancreas consistent with history of  pancreatitis, and a small amount of ascites.   HISTORY AND PHYSICAL:  For full H&P please consult the chart but in brief,  Mia Bartlett is a 50 year old Caucasian woman with a history of recurrent  pancreatitis of no known cause who presented with a chief complaint of  severe abdominal pain located especially in the right upper quadrant that  first started in the morning.  She did endorse that she has been having pain  for weeks but it was not as sharp as it was when she woke up this morning.  It awoke her from her sleep and was 10/10 pain.  She also complained of  anorexia,  nausea and vomiting, and the pain was aggravated by certain  positions and palpation.  She denies any alcohol abuse, her triglycerides  are normal, and she has no evidence of gallbladder disease.  Her gallbladder  was evaluated 2 weeks prior to her presentation at the emergency department  and showed no evidence of gallbladder disease.  She was only taking  Neurontin and estrogen on presentation but the timing of when she started  taking estrogen did not coincide with the timing of when she began to have  her episodes of pancreatitis which was in the late 1990s.  Physical exam:  Vital signs were stable.  In general, she is alert and oriented in acute  distress but well hydrated.  Heart regular rate and rhythm, no murmurs,  rubs, or gallops.  Lungs clear to auscultation.  Abdomen soft, nondistended,  no guarding and no rebound tenderness but hypoactive bowel sounds and very  tender abdomen diffusely.  She was tearful and complaining  of severe pain.  Extremities normal, no abnormalities.  Neurologic:  She is alert and  oriented, no focal deficits.  Skin normal color, no rashes.  Psychiatric:  She was alert and oriented.  In the emergency room she got narcotics and  antiemetics and was admitted.  Her CBC showed leukocytosis with a white cell  count of 13.7 and ANC of 11.8.  Her amylase was 327, her lipase was 356, but  her LFTs and electrolytes were normal.  Her urinalysis was also negative.  She was admitted for supportive treatment.   HOSPITAL COURSE:  #1 - ABDOMINAL PAIN WHICH WAS DEEMED TO BE ACUTE  PANCREATITIS.  She was admitted and the CT was followed up by an ultrasound  to reevaluate her gallbladder and that was, of course, negative.  She was  treated with narcotics and required a Dilaudid pump due to severe pain,  which was soon switched to IV Dilaudid then morphine over the next 2 days.  She was held n.p.o. but then allowed to take her Neurontin which she says  keeps her headaches  away.  She tolerated full liquids on the third day of  admission.  She had another fasting lipid panel which was done and LDL was  175, triglycerides were 191, and total cholesterol 242, so the slightly  elevated triglycerides were not enough to explain her acute pancreatitis.   #2 - TRIGEMINAL NEURALGIA.  She was complaining severely of headache by the  second day of admission so we restarted her Neurontin which we allowed her  to take p.o.  She tolerated this well and the next day her diet was  advanced.  She says that Neurontin is the only thing that helps her for  pain.  She was discharged with a prescription for her Percocet to treat any  residual tenderness or pain in her stomach.  She got 40 pills and is  instructed to return to her primary care doctor, Dr. Elnoria Howard, for further  prescriptions if she should need any.  She is instructed to continue on her  Neurontin but was told to discontinue her estrogen since she had been off of  it for a week and had no symptoms.  She is to follow up with Dr. Elnoria Howard and  her primary care doctor, Dr. Lacie Scotts, and will be calling them for  appointments.      Clearance Coots, M.D.      Eliseo Gum, M.D.  Electronically Signed    IN/MEDQ  D:  08/19/2005  T:  08/20/2005  Job:  [id number]   cc:   Jordan Hawks. Elnoria Howard, MD  Fax: [phone]   Evelene Croon  Fax: [phone]

## 2010-09-05 NOTE — H&P (Signed)
NAMEMarland Bartlett  CHERYLEE, RAWLINSON NO.:  [id number]   MEDICAL RECORD NO.:  [id number]          PATIENT TYPE:  EMS   LOCATION:  ED                           FACILITY:  [facility]   PHYSICIAN:  Sherin Quarry, MD      DATE OF BIRTH:  Apr 25, 1960   DATE OF ADMISSION:  03/02/2005  DATE OF DISCHARGE:                                HISTORY & PHYSICAL   Mia Bartlett is a 50 year old lady, who has a history of chronic  recurrent acute pancreatitis that dates back to 1998.  She states that she  has episodes about once a month which can usually be aborted by drinking  Pedialyte and not eating any solid food for awhile.  Her last severe episode  of pancreatitis was in May of this year.  At that time, her symptoms  responded over a 4 day period to intravenous fluid, bowel rest, and pain  medications.  She states that over this entire period, she really has had  very little evaluation of the etiology of the pancreatitis.  She does recall  that in 2003, she had upper endoscopy and colonoscopy done at [facility] which was  diagnostically unrevealing.  During her last hospitalization, she was seen  by Dr. Elnoria Howard, who suggested that perhaps the patient had a very subtle  gallbladder problem or perhaps she had a sphincter of Oddi dysfunction and  might need evaluation at [facility] or [facility].  The patient's ability to have  further evaluation of her problem has been very limited by her lack of  insurance.  Mia Bartlett indicates that yesterday she began to experience  severe right upper quadrant discomfort which did not respond to drinking  Pedialyte.  She had 1 episode of vomiting this morning.  Pain seems to  radiate through to the back and is associated with malaise, difficulty  sleeping, and diaphoresis.  She presented to the [facility] Emergency Room.  Her blood pressure was 121/80.  She was administered Dilaudid 1 mg which  partially relieved her pain.  Initial laboratory studies included a white  count 9800.  Electrolytes were within normal limits.  Liver functions were  normal.  Lipase was greater than 2000.  Ms. Welles is admitted at this  time for presumed flare of acute pancreatitis.   PAST MEDICAL HISTORY:   MEDICATIONS:  1.  Celebrex 200 mg daily.  2.  __________ 8 mg at bedtime p.r.n. sleep.  3.  Cymbalta 60 mg daily.  4.  Xanax 0.5 mg p.r.n. for anxiety.  5.  Neurontin 600 mg t.i.d.  6.  Estropipate 1.5 mg daily.  7.  Prilosec 20 mg daily.  8.  Percocet 5/325, 1-2 q.4-6h. p.r.n. pain.   ALLERGIES:  She is allergic to DOXYCYCLINE.   OPERATIONS:  She has had a previous hysterectomy and 3 C-sections.   FAMILY HISTORY:  There is a significant history of diabetes and hypertension  in the family.   SOCIAL HISTORY:  The patient says that she will drink 1 alcoholic beverage  about every other month and definitely has not been consuming any alcohol  recently.  She does not smoke.  She does not use any illicit drugs.  She is  married.   REVIEW OF SYSTEMS:  HEAD:  The patient states that she has chronic  trigeminal neuralgia and for this reason, requires chronic pain medication.  CHEST:  She denies coughing, wheezing, or chest congestion.  CARDIOVASCULAR:  Denies orthopnea, PND, or ankle edema.  GI:  See above.  There has been no  hematemesis, no melena.  Bowel function has been normal.  GU:  Denies  dysuria or urinary frequency.  NEUROLOGIC:  There is no history of seizure  or stroke.  ENDO:  Denies excess thirst, urinary frequency, or nocturia.   PHYSICAL EXAMINATION:  VITAL SIGNS:  Her temperature is 97.1, blood pressure  121/80.  Pulse is 80, respirations 20, O2 saturation 98%.  HEENT:  Within normal limits.  CHEST:  Clear.  BACK:  No CVA or point tenderness.  CARDIOVASCULAR:  Normal S1 and S2 without rubs, murmurs, or gallops.  ABDOMEN:  Remarkable for moderate right upper quadrant tenderness.  There is  no guarding or rebound.  No masses are appreciated.   Bowel sounds are  present.  NEUROLOGIC:  Cranial nerves, motor, sensory, and cerebellar testing is  normal.  EXTREMITIES:  No evidence of cyanosis or edema.   IMPRESSION:  1.  Acute pancreatitis, chronic/recurrent.  2.  History of trigeminal neuralgia.  3.  History of depression.  4.  Chronic pain.  5.  Status post hysterectomy.   PLAN:  We will make the patient NPO.  We will give her intravenous fluids.  In the past, a PCA pump has been the best thing to use for relieving her  pain.  We will get an abdominal ultrasound, follow her electrolytes, and  continue her proton pump inhibitor.  I think this patient really needs to  see a gastroenterologist for further follow up of her pancreatitis.  Possibly, the best way to accomplish this is going to be to refer her to an  academic institution.  We will discuss this further.  I broached the subject  with the patient.           ______________________________  Sherin Quarry, MD     SY/MEDQ  D:  03/02/2005  T:  03/02/2005  Job:  [id number]   cc:   Dr. Delfino Lovett  [place]   Dr. Cheryln [employer]  [place]   Jordan Hawks. Elnoria Howard, MD  Fax: [phone]

## 2010-09-05 NOTE — Discharge Summary (Signed)
Mia Bartlett, Mia Bartlett             ACCOUNT NO.:  [id number]   MEDICAL RECORD NO.:  [id number]          PATIENT TYPE:  INP   LOCATION:  1613                         FACILITY:  [facility]   PHYSICIAN:  Melissa L. Ladona Ridgel, MD  DATE OF BIRTH:  1960/05/21   DATE OF ADMISSION:  03/02/2005  DATE OF DISCHARGE:  03/04/2005                                 DISCHARGE SUMMARY   DISCHARGE DIAGNOSES:  1.  Acute recurrent pancreatitis.  The patient was admitted to the hospital,      hydrated with IV fluids and treated with pain medications.  She was kept      on bowel rest.  Her symptoms seem to have abated; however, her enzymes      remain significantly elevated.  She underwent MRCP which will be      followed up by Dr. Jeani Hawking in the outpatient setting.  Plan is to      continue her pancreas enzymes with meals and discharge her with adequate      oral pain medication and have her follow up with Dr. Elnoria Howard in two weeks.  2.  Fibromyalgia.  The patient will resume her Neurontin and Cymbalta.  3.  Sinus tachycardia.  This appears to be improving with good decrease in      her pain.  4.  Trigeminal neuralgia.  Will resume her Neurontin and Cymbalta which also      helps with treating this.  5.  Depression.  Cymbalta will be resumed.   DISCHARGE MEDICATIONS:  1.  Neurontin 600 mg p.o. t.i.d.  2.  Xanax 1 mg 1/2 to 1 tablet p.o. daily p.r.n.  3.  Celebrex 200 mg p.o. daily.  4.  Estropipate 1.5 mg p.o. daily.  5.  Cymbalta 60 mg p.o. daily.  6.  Pancreas enzymes two capsules p.o. a.c.  7.  Rozerem 8 mg p.o. q.h.s.  8.  Percocet 5/325 1-2 tablets p.o. q.4-6 h p.r.n.  9.  Ketorolac 10 mg p.o. q.4-6 h p.r.n. for the next two days and then      discontinue.   HISTORY OF PRESENT ILLNESS:  The patient is a 50 year old white female who  has had recurrent pancreatitis times several bouts without obvious source  for her disease.  The patient had a complete workup during past admissions.  She does not drink  alcohol, nor does she have elevated triglycerides.  She  was admitted to the hospital and treated with bowel rest, pain medications  and IV hydration.  She was seen in consult by Dr. Jeani Hawking who had seen  her in the past.  An MRCP was completed, the results of which are pending at  the time of discharge indication.  The patient has been cleared to discharge  to home to follow up with Dr. Elnoria Howard in two weeks.  Question becomes whether  this patient has a pancreatic divisum, which may account for the recurrent  pancreatitis.  The patient, on the day of discharge, is significantly  improved clinically and wishes to go home.  I have requested that she at  least try to eat  one meal to determine whether or not her pain recurs, since  her enzymes remain elevated.   PHYSICAL EXAMINATION:  VITAL SIGNS:  Temperature 97.7, blood pressure  125/81, pulse 87, respirations 20, saturations 97% on room air.  GENERAL APPEARANCE:  Well-developed, well-nourished white female in no acute  distress.  HEENT:  She is normocephalic, atraumatic.  Pupils equal, round and reactive  to light.  Extraocular movements intact.  Mucous membranes moist.  NECK:  Supple.  No JVD.  No lymphadenopathy.  No carotid bruits.  CHEST:  Clear to auscultation.  No rhonchi, rales or wheezes.  CARDIOVASCULAR:  Regular rate and rhythm.  Normal S1, S2.  No S3, S4.  No  murmurs, rubs or gallops.  ABDOMEN:  Soft, minimally tender in the superior epigastric area with no  guarding or rebound.  She has bowel sounds.  EXTREMITIES:  No cyanosis, clubbing or edema.   LABORATORY DATA:  On the day of discharge, her white count is 6.4 with  hemoglobin of 9.7 and hematocrit 27.9, just significantly decreased from her  admission hemoglobin of 13.7 and 39.8.  Sodium 140, potassium 3.9, chloride  102, CO2 27, BUN 5, creatinine 0.8, glucose 90.  Amylase is 1409, lipase is  382 down from 1865 and 3242 with amylase.  Lipid profile is pending.    IMPRESSION:  At this time, the patient will be recommended to follow up with  Dr. Elnoria Howard in two weeks.  Also, will request that she establish a primary care  physician to follow up on her anemia.  If she does not tolerate a meal, I  will request that she leave against my advise, as at the time of discharge,  I would prefer she would be relatively pain free, but at this time, the  patient is insisting to be discharged to home.      Melissa L. Ladona Ridgel, MD  Electronically Signed     MLT/MEDQ  D:  03/04/2005  T:  03/04/2005  Job:  [id number]   cc:   Jordan Hawks. Elnoria Howard, MD  Fax: [phone]

## 2010-09-05 NOTE — Consult Note (Signed)
NAMEMarland Kitchen  CHANDRA, ASHER NO.:  [id number]   MEDICAL RECORD NO.:  [id number]          PATIENT TYPE:  INP   LOCATION:  5712                         FACILITY:  [facility]   PHYSICIAN:  Jordan Hawks. Elnoria Howard, MD    DATE OF BIRTH:  15-Aug-1960   DATE OF CONSULTATION:  09/16/2004  DATE OF DISCHARGE:                                   CONSULTATION   REASON FOR CONSULTATION:  Pancreatitis.   HISTORY OF PRESENT ILLNESS:  This is a 50 year old white female with a past  medical history of recurrent and acute pancreatitis as well as trigeminal  neuralgia who presents to the emergency room  with epigastric pain, nausea  and vomiting.  The patient states that the pain is typical of her  pancreatitis flare and the last flare was approximately one year ago.  Previous to this time, the patient states that she would have intermittent  flares that could be taken care of as an outpatient.  As a baseline, she had  a chronic type of pain but within the past year, this has resolved and she  is uncertain if this is secondary to taking herbal medications or changing  of her diet.  In 2000, the patient developed pancreatitis for the first time  and subsequently has had multiple recurrent attacks with only 2-3  hospitalizations for these attacks.  She states in the interim period, she  would have a baseline level of pain.  Due to the development of the  pancreatitis, she states having noting blisters before on her face and  subsequently development of pain of her face which is possibly the result of  her diagnosis of trigeminal neuralgia at this time.  She states there was a  prior workup, however, no etiology has been found currently. The patient  denies having nay prior history of gallbladder disease or alcohol use.  She  also denies having any family history of cystic fibrosis or other pancreatic  disorders.   ALLERGIES:  Doxycycline.   PAST MEDICAL HISTORY:  As stated above with the addition of  hysterectomy.   MEDICATIONS:  Restoril, Oxycodone, Carbamazepine   SOCIAL HISTORY:  The patient very rarely drinks alcohol, no tobacco use, no  illicit drug use.  The patient is married.   FAMILY HISTORY:  Significant for diabetes and hypertension.  She has four  children and lives with her husband.   REVIEW OF SYMPTOMS:  As stated in the history of present illness.  Negative  for dysuria, arthralgia, arthritis, shortness of breath.  Positive for sinus  pressure and chest discomfort.   PHYSICAL EXAMINATION:  VITAL SIGNS:  Blood pressure 123/77, heart rate 81, respirations 20,  temperature 98.5, pulse ox 99% on room air.  GENERAL:  The patient is in no acute distress, alert and oriented.  HEENT:  Normocephalic, atraumatic, extraocular movements intact, pupils  equal, round, reactive to light.  NECK:  Supple, no lymphadenopathy.  LUNGS:  Clear to auscultation bilaterally.  HEART:  Regular rate and rhythm without murmurs, gallops, and rubs.  ABDOMEN:  Soft, tender in the epigastric area and also the right upper  quadrant.  EXTREMITIES:  No cyanosis, clubbing, and edema.   LABORATORY DATA:  Sep 16, 2004, white blood cell count 6.8, hemoglobin 11.4,  platelets 349.  Sodium 137, potassium 3.5, chloride 104, CO2 27, BUN 3,  creatinine 0.9, glucose 105.  Lipase 128.   CT scan of the abdomen revealed a diffusely enlarged pancreatic tail with  extensive peripancreatic fluid tracking to the medial aspect of the spleen  and extending along the lateral fascia, no evidence of any gallstones,  cholecystitis, or ductal dilatation.   IMPRESSION:  1.  Acute, recurrent pancreatitis.  2.  Trigeminal neuralgia.   After an extensive discussion, it is apparent that the patient has acute  intermittent attacks.  There is no CT scan imaging consistent with chronic  pancreatitis.  These are acute, recurrent attacks, however, the etiology is  unknown.  This patient does not have any alcohol history  and there is no  obvious evidence of gallstones, although microlithiasis can still be a  consideration and the possibility of a cholecystectomy within the near  future needs to be considered.  Other etiologies such as inherited disorders  are rare and it is uncertain if this patient is a candidate for that type of  etiology.  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  The most optimal course would be for the patient to  undergo a sphincter of Oddi manometry at a teaching institution, I am  uncertain if [place] performs this type of evaluation versus [facility] or [place].  Additionally, the findings on the CT scan can be consistent with a  focal autoimmune pancreatitis, however, this can be difficult to evaluate.  Overall, these patient's are typically managed with chronic pain medications  without any etiology discernible.   PLAN:  1.  Continue with pain medications, can switch to Percocet 5/325 mg 1-2      tablets p.o. q.4-6h. p.r.n.  2.  MRCP as an outpatient or inpatient.  3.  The patient may be tried on nonenteric coated pancrelipase in the future      as an outpatient.      PDH/MEDQ  D:  09/16/2004  T:  09/16/2004  Job:  [id number]   cc:   Hillery Aldo, M.D.  Int. Med. - Resident - [address]  [place], [place] [address]  Fax: [phone]

## 2010-09-05 NOTE — Consult Note (Signed)
NAMEMarland Kitchen  Mia Bartlett, Mia Bartlett NO.:  [id number]   MEDICAL RECORD NO.:  [id number]          PATIENT TYPE:  INP   LOCATION:  1613                         FACILITY:  [facility]   PHYSICIAN:  Jordan Hawks. Elnoria Howard, MD    DATE OF BIRTH:  July 31, 1960   DATE OF CONSULTATION:  03/03/2005  DATE OF DISCHARGE:                                   CONSULTATION   <REASON FOR CONSULTATION/>  Pancreatitis.   HISTORY OF PRESENT ILLNESS:  This is a 50 year old white female with past  medical history of acute, recurrent pancreatitis, who represents with  similar type of symptoms.  She states that her symptoms probably started two  weeks ago and subsequently worsened as the time passed.  This past week, she  states that her abdominal pain had increased acutely, and she was having  marked difficulty tolerating the pain.  She typically treats herself with  Pedialyte; however, this was not able to alleviate her pain.  She  subsequently required admission to the hospital.  The laboratory values did  confirm that her lipase is markedly elevated and consistent with  pancreatitis.  This patient was previously evaluated in May, 2006 for the  same presentation.  She was instructed to have further outpatient workup.  Unfortunately, she is without insurance and did not pursue any further  workup in regards to this matter.  The patient before the acute onset of her  pancreatitis was in her usual state of health and able to care about her  activities of daily living.  She denied any weight loss, diarrhea, nausea or  vomiting.  She denies taking any over the counter medications, and the  current medications that she is taking do not appear to have relation with  the pancreatitis.   ALLERGIES:  DOXYCYCLINE.   PAST MEDICAL HISTORY:  As stated above with the addition of hysterectomy.   MEDICATIONS:  Hydromorphone, Protonix, Benadryl, Lorazepam, Reglan, and  Zofran.   SOCIAL HISTORY:  Patient denies any alcohol,  tobacco, or illegal drug use.   FAMILY HISTORY:  Significant for diabetes and hypertension.  She has four  children.  Lives with her husband.   REVIEW OF SYSTEMS:  As per the history of present illness and is negative  for any dysuria, arthralgia, arthritis, shortness of breath, headache,  dizziness, vertigo, chest pain, shortness of breath.   PHYSICAL EXAMINATION:  VITAL SIGNS:  Blood pressure 136/81, heart rate 100,  respirations 18, temperature 97.8.  GENERAL:  The patient is in no acute distress.  Alert and oriented.  HEENT:  Normocephalic and atraumatic.  Extraocular muscles are intact.  Pupils are equal, round and reactive to light.  NECK:  Supple.  No lymphadenopathy.  LUNGS:  Clear to auscultation bilaterally.  CARDIOVASCULAR:  Regular rate and rhythm.  ABDOMEN:  Flat.  Soft.  Tender to palpation in the mid epigastric region as  well as the right upper quadrant.  Positive bowel sounds.  EXTREMITIES:  No clubbing, cyanosis or edema.   LABORATORY VALUES:  On admission, white blood cell count 9.8, hemoglobin  13.6, MCV 88.1, platelets 358.  Sodium 138,  potassium 4.5, chloride 104, CO2  25, glucose 105, BUN 8, creatinine 0.5, total bilirubin 0.7, alk phos 84,  AST 25, ALT 16, albumin 3.9.  Lipase is greater than 2000.   IMPRESSION:  Acute recurrent pancreatitis:  I am uncertain about the  patient's etiology at this time; however, further evaluation is required.  She was unable to obtain further evaluation as an outpatient because of her  lack of insurance.  It is clear that she does require further imaging with  an MRCP to discern if there is any evidence of ductal obstruction or  pancreatic deivisum.  Current imaging from the prior CT scan in May and  ultrasound is negative for any ductal dilation in the biliary tract or the  pancreas.  There does not appear to be any overt evidence of chronic  pancreatitis.  Pending the results of the MRCP, further evaluation can be   performed.   PLAN:  1.  MRCP.  2.  Continue with the pain medications.  3.  Continue with supportive care.      Jordan Hawks Elnoria Howard, MD  Electronically Signed     PDH/MEDQ  D:  03/03/2005  T:  03/03/2005  Job:  [id number]

## 2011-01-16 LAB — COMPREHENSIVE METABOLIC PANEL WITH GFR
Alkaline Phosphatase: 111
BUN: 9
Chloride: 107
Creatinine, Ser: 0.69
Glucose, Bld: 110 — ABNORMAL HIGH
Potassium: 3.7
Total Bilirubin: 0.8
Total Protein: 6.9

## 2011-01-16 LAB — COMPREHENSIVE METABOLIC PANEL
ALT: 44 — ABNORMAL HIGH
AST: 99 — ABNORMAL HIGH
Albumin: 4
CO2: 26
Calcium: 9.4
GFR calc Af Amer: >60
GFR calc non Af Amer: >60
Sodium: 139

## 2011-01-16 LAB — CBC
HCT: 35.7 — ABNORMAL LOW
Hemoglobin: 12.3
MCHC: 34.4
MCV: 87.7
Platelets: 287
RBC: 4.07
RDW: 13.2
WBC: 7.2

## 2011-01-16 LAB — URINALYSIS, ROUTINE W REFLEX MICROSCOPIC
Bilirubin Urine: NEGATIVE
Glucose, UA: NEGATIVE
Ketones, ur: NEGATIVE
Nitrite: POSITIVE — AB
Protein, ur: NEGATIVE
Specific Gravity, Urine: 1.009
Urobilinogen, UA: 1
pH: 7.5

## 2011-01-16 LAB — URINE CULTURE: Colony Count: >=100000

## 2011-01-16 LAB — DIFFERENTIAL
Basophils Absolute: 0
Basophils Relative: 1
Eosinophils Absolute: 0.1
Eosinophils Relative: 1
Lymphocytes Relative: 30
Lymphs Abs: 2.1
Monocytes Absolute: 0.4
Monocytes Relative: 6
Neutro Abs: 4.5
Neutrophils Relative %: 63

## 2011-01-16 LAB — LIPASE, BLOOD: Lipase: 31

## 2011-01-16 LAB — URINE MICROSCOPIC-ADD ON

## 2011-08-18 ENCOUNTER — Emergency Department (HOSPITAL_COMMUNITY): Payer: Self-pay

## 2011-08-18 ENCOUNTER — Emergency Department (HOSPITAL_COMMUNITY)
Admission: EM | Admit: 2011-08-18 | Discharge: 2011-08-19 | Disposition: A | Discharge status: Home / Self Care | Payer: Self-pay | Attending: Emergency Medicine | Admitting: Emergency Medicine

## 2011-08-18 ENCOUNTER — Encounter (HOSPITAL_COMMUNITY): Payer: Self-pay | Admitting: Family Medicine

## 2011-08-18 DIAGNOSIS — K861 Other chronic pancreatitis: Secondary | ICD-10-CM | POA: Insufficient documentation

## 2011-08-18 DIAGNOSIS — R10816 Epigastric abdominal tenderness: Secondary | ICD-10-CM | POA: Insufficient documentation

## 2011-08-18 DIAGNOSIS — R1013 Epigastric pain: Secondary | ICD-10-CM | POA: Insufficient documentation

## 2011-08-18 DIAGNOSIS — R10811 Right upper quadrant abdominal tenderness: Secondary | ICD-10-CM | POA: Insufficient documentation

## 2011-08-18 HISTORY — DX: Acute pancreatitis without necrosis or infection, unspecified: K85.90

## 2011-08-18 LAB — URINALYSIS, ROUTINE W REFLEX MICROSCOPIC
Bilirubin Urine: NEGATIVE
Ketones, ur: NEGATIVE mg/dL
Nitrite: NEGATIVE
Specific Gravity, Urine: 1.011 (ref 1.005–1.030)
Urobilinogen, UA: 0.2 mg/dL (ref 0.0–1.0)
pH: 7.5 (ref 5.0–8.0)

## 2011-08-18 LAB — CBC
HCT: 40.7 % (ref 36.0–46.0)
Hemoglobin: 13.7 g/dL (ref 12.0–15.0)
MCH: 29.3 pg (ref 26.0–34.0)
MCHC: 33.7 g/dL (ref 30.0–36.0)
MCV: 87.2 fL (ref 78.0–100.0)
RDW: 12.3 % (ref 11.5–15.5)

## 2011-08-18 LAB — COMPREHENSIVE METABOLIC PANEL
BUN: 13 mg/dL (ref 6–23)
CO2: 28 mEq/L (ref 19–32)
Calcium: 9.7 mg/dL (ref 8.4–10.5)
GFR calc Af Amer: >90 mL/min (ref >90)
GFR calc non Af Amer: 84 mL/min — ABNORMAL LOW (ref >90)
Glucose, Bld: 96 mg/dL (ref 70–99)
Total Protein: 8.1 g/dL (ref 6.0–8.3)

## 2011-08-18 LAB — URINE MICROSCOPIC-ADD ON

## 2011-08-18 MED ORDER — ONDANSETRON HCL 4 MG/2ML IJ SOLN
4.0000 mg | Freq: Once | INTRAMUSCULAR | Status: AC
  Administered 2011-08-18: 4 mg via INTRAVENOUS
  Filled 2011-08-18: qty 2

## 2011-08-18 MED ORDER — HYDROMORPHONE HCL PF 1 MG/ML IJ SOLN
1.0000 mg | Freq: Once | INTRAMUSCULAR | Status: AC
  Administered 2011-08-18: 1 mg via INTRAVENOUS
  Filled 2011-08-18: qty 1

## 2011-08-18 MED ORDER — SODIUM CHLORIDE 0.9 % IV SOLN
Freq: Once | INTRAVENOUS | Status: AC
  Administered 2011-08-18: 20:00:00 via INTRAVENOUS

## 2011-08-18 MED ORDER — HYDROMORPHONE HCL PF 2 MG/ML IJ SOLN
2.0000 mg | Freq: Once | INTRAMUSCULAR | Status: AC
  Administered 2011-08-18: 2 mg via INTRAVENOUS
  Filled 2011-08-18: qty 1

## 2011-08-18 MED ORDER — DICYCLOMINE HCL 10 MG/ML IM SOLN
20.0000 mg | Freq: Once | INTRAMUSCULAR | Status: AC
  Administered 2011-08-18: 20 mg via INTRAMUSCULAR
  Filled 2011-08-18: qty 4

## 2011-08-18 MED ORDER — GI COCKTAIL ~~LOC~~
30.0000 mL | Freq: Once | ORAL | Status: AC
  Administered 2011-08-18: 30 mL via ORAL
  Filled 2011-08-18: qty 30

## 2011-08-18 NOTE — ED Notes (Signed)
Patient states that she has abdominal pain for a week, worse yesterday. History of pancreatitis. States vomiting yellow emesis and having loose green stool.

## 2011-08-18 NOTE — ED Provider Notes (Signed)
History     CSN: [id number]  Arrival date & time 08/18/11  1850   First MD Initiated Contact with Patient 08/18/11 2013      Chief Complaint  Patient presents with  . Abdominal Pain    (Consider location/radiation/quality/duration/timing/severity/associated sxs/prior treatment) HPI  Patient presents to the ER from home for severe mid epigastric abdominal pain. She has a long history of pancreatitis due to unknown cause and says that this feels like the same. She has chronic pain in her epigastrium for years. The pain got a little bit worse  1 week ago but today became very severe, it has been accompanied by vomiting. NO fevers, diarrhea, back pain, chest pain, shortness of breath. The pain radiates around her RUQ of her abdomen. Pt is no acute distress but appears to be in very uncomfortable.  Past Medical History  Diagnosis Date  . Pancreatitis     Past Surgical History  Procedure Date  . Abdominal hysterectomy   . Cesarean section     No family history on file.  History  Substance Use Topics  . Smoking status: Never Smoker   . Smokeless tobacco: Not on file  . Alcohol Use: No    OB History    Grav Para Term Preterm Abortions TAB SAB Ect Mult Living                  Review of Systems   HEENT: denies blurry vision or change in hearing PULMONARY: Denies difficulty breathing and SOB CARDIAC: denies chest pain or heart palpitations MUSCULOSKELETAL:  denies being unable to ambulate ABDOMEN AL: pt admits abdominal pain and vomiting. No diarrhea GU: denies loss of bowel or urinary control NEURO: denies numbness and tingling in extremities   Allergies  Doxycycline  Home Medications  No current outpatient prescriptions on file.  BP 135/85  Pulse 55  Temp(Src) 97.4 F (36.3 C) (Oral)  Resp 18  Wt 125 lb 4 oz (56.813 kg)  SpO2 100%  Physical Exam  Nursing note and vitals reviewed. Constitutional: She appears well-developed and well-nourished. No  distress.  HENT:  Head: Normocephalic and atraumatic.  Eyes: Pupils are equal, round, and reactive to light.  Neck: Normal range of motion. Neck supple.  Cardiovascular: Normal rate and regular rhythm.   Pulmonary/Chest: Effort normal.  Abdominal: Soft. She exhibits no distension, no fluid wave, no ascites and no mass. There is tenderness in the right upper quadrant and epigastric area. There is no rigidity, no rebound, no guarding and no CVA tenderness.    Neurological: She is alert.  Skin: Skin is warm and dry.    ED Course  Procedures (including critical care time)  Labs Reviewed  COMPREHENSIVE METABOLIC PANEL - Abnormal; Notable for the following:    GFR calc non Af Amer 84 (*)    All other components within normal limits  URINALYSIS, ROUTINE W REFLEX MICROSCOPIC - Abnormal; Notable for the following:    Hgb urine dipstick SMALL (*)    Leukocytes, UA SMALL (*)    All other components within normal limits  CBC  LIPASE, BLOOD  PREGNANCY, URINE  URINE MICROSCOPIC-ADD ON   US Abdomen Complete  08/18/2011  *RADIOLOGY REPORT*  Clinical Data:  History of pancreatitis, now with severe mid epigastric abdominal pain  COMPLETE ABDOMINAL ULTRASOUND  Comparison:  Abdominal ultrasound is 09/26/2008  Findings:  Gallbladder:  Sonographically normal.  No echogenic gallstones or sludge.  No gallbladder wall thickening.  No pericholecystic fluid. Negative sonographic Murphy's sign.  Common bile duct:  Normal in size measuring 3.8 mm in diameter.  Liver:  Homogeneous hepatic echotexture.  No discrete hepatic lesions.  No definite intrahepatic biliary duct dilatation.  No ascites.  IVC:  Appears normal.  Pancreas:  Limited visualization of the pancreatic head and neck is normal.  The pancreatic body and tail are obscured by bowel gas.  Spleen:  Normal in size measuring 8.1 cm in length.  Right Kidney:  Normal cortical thickness, echogenicity and size, measuring 9.8 cm in length.  No focal renal  lesions.  No echogenic renal stones.  No urinary obstruction.  Left Kidney:  Normal cortical thickness, echogenicity and size, measuring 10.2 cm in length.  No focal renal lesions.  No echogenic renal stones.  No urinary obstruction.  Abdominal aorta:  No aneurysm identified, though note, the mid and distal aspect of the abdominal aorta is not visualized.  IMPRESSION:  1.  No explanation for patient's epigastric abdominal pain. Specifically, limited visualization of the pancreatic head and neck is normal.  If acute pancreatitis remains of clinical concern, further evaluation be obtained with abdominal CT as indicated.  2.  No cholelithiasis.  Original Report Authenticated By: Waynard Reeds, M.D.     1. Abdominal pain       MDM  pts pain relieved with 2mg  IV Dilaudid and 4mg  Zofran. After the Ultrasound her pain returned. Pt given another 1 mg Dilaudid with minimal improvement. A GI cocktail was given and it helped minimally. Pt was then given Bentyl and it helped mildly. Prior to discharge the patients abdominal was re-evaluated and her pain is not as severe as when she came. Abdomen is soft, no guarding.  Pt given Rx for Percocet and Zofran.  Pt has been advised of the symptoms that warrant their return to the ED. Patient has voiced understanding and has agreed to follow-up with the PCP or specialist.          Dorthula Matas, PA 08/19/11 [phone]

## 2011-08-19 MED ORDER — ONDANSETRON HCL 4 MG PO TABS: 4.0000 mg | ORAL_TABLET | Freq: Four times a day (QID) | ORAL | Status: AC

## 2011-08-19 MED ORDER — OXYCODONE-ACETAMINOPHEN 5-325 MG PO TABS: 1.0000 | ORAL_TABLET | Freq: Four times a day (QID) | ORAL | Status: AC | PRN

## 2011-08-19 NOTE — ED Provider Notes (Signed)
Medical screening examination/treatment/procedure(s) were performed by non-physician practitioner and as supervising physician I was immediately available for consultation/collaboration.   Ledora Delker, MD 08/19/11 1505 

## 2011-08-19 NOTE — Discharge Instructions (Signed)

## 2011-12-25 ENCOUNTER — Encounter (HOSPITAL_COMMUNITY): Payer: Self-pay | Admitting: *Deleted

## 2011-12-25 ENCOUNTER — Emergency Department (INDEPENDENT_AMBULATORY_CARE_PROVIDER_SITE_OTHER)
Admission: EM | Admit: 2011-12-25 | Discharge: 2011-12-25 | Disposition: A | Discharge status: Home / Self Care | Payer: Self-pay | Source: Home / Self Care | Attending: Emergency Medicine | Admitting: Emergency Medicine

## 2011-12-25 DIAGNOSIS — L0100 Impetigo, unspecified: Secondary | ICD-10-CM

## 2011-12-25 HISTORY — DX: Chronic fatigue, unspecified: R53.82

## 2011-12-25 HISTORY — DX: Trigeminal neuralgia: G50.0

## 2011-12-25 HISTORY — DX: Fibromyalgia: M79.7

## 2011-12-25 MED ORDER — HYDROCODONE-ACETAMINOPHEN 5-325 MG PO TABS
ORAL_TABLET | ORAL | Status: AC
  Filled 2011-12-25: qty 1

## 2011-12-25 MED ORDER — MUPIROCIN 2 % EX OINT: TOPICAL_OINTMENT | Freq: Three times a day (TID) | CUTANEOUS | Status: AC

## 2011-12-25 MED ORDER — HYDROCODONE-ACETAMINOPHEN 5-325 MG PO TABS
1.0000 | ORAL_TABLET | Freq: Once | ORAL | Status: AC
Start: 2011-12-25 — End: 2011-12-25
  Administered 2011-12-25: 1 via ORAL

## 2011-12-25 MED ORDER — TRAMADOL HCL 50 MG PO TABS: 100.0000 mg | ORAL_TABLET | Freq: Three times a day (TID) | ORAL | Status: AC | PRN

## 2011-12-25 MED ORDER — CEPHALEXIN 500 MG PO CAPS: 500.0000 mg | ORAL_CAPSULE | Freq: Three times a day (TID) | ORAL | Status: AC

## 2011-12-25 NOTE — ED Provider Notes (Signed)
Chief Complaint  Patient presents with  . Herpes Zoster    History of Present Illness:   The patient is a 51 year old female with a history of pancreatitis, chronic fatigue, trigeminal neuralgia, and fibromyalgia. She presents tonight with a one-week history of a rash on her scalp and neck and also lesions on her arms, legs, and back. She describes these as being very painful and she also has a headache. She denies any fever or chills. She states she's never had anything like this before.  Review of Systems:  Other than noted above, the patient denies any of the following symptoms: Systemic:  No fever, chills, sweats, weight loss, or fatigue. ENT:  No nasal congestion, rhinorrhea, sore throat, swelling of lips, tongue or throat. Resp:  No cough, wheezing, or shortness of breath. Skin:  No rash, itching, nodules, or suspicious lesions.  PMFSH:  Past medical history, family history, social history, meds, and allergies were reviewed.  Physical Exam:   Vital signs:  BP 164/105  Pulse 84  Temp 98.2 F (36.8 C) (Oral)  Resp 18  SpO2 100% Gen:  Alert, oriented, in severe distress, the degree of her distress seems out of proportion to the physical findings. ENT:  Pharynx clear, no intraoral lesions, moist mucous membranes. Lungs:  Clear to auscultation. Skin:  She has multiple crusted areas on her scalp, neck, behind the ears, and on her upper back. She has a few tiny lesions on her arms and legs as well.           Course in Urgent Care Center:   She was given hydrocodone/APAP 5/325 by mouth and tolerated this well without any immediate side effects.  Assessment:  The encounter diagnosis was Impetigo.  Plan:   1.  The following meds were prescribed:   New Prescriptions   CEPHALEXIN (KEFLEX) 500 MG CAPSULE    Take 1 capsule (500 mg total) by mouth 3 (three) times daily.   MUPIROCIN OINTMENT (BACTROBAN) 2 %    Apply topically 3 (three) times daily.   TRAMADOL (ULTRAM) 50 MG TABLET     Take 2 tablets (100 mg total) by mouth every 8 (eight) hours as needed for pain.   2.  The patient was instructed in symptomatic care and handouts were given. 3.  The patient was told to return if becoming worse in any way, if no better in 3 or 4 days, and given some red flag symptoms that would indicate earlier return.     Reuben Likes, MD 12/25/11 2258

## 2011-12-25 NOTE — ED Notes (Signed)
Pt reports she has had shingles several times.  Today she c/o 1 week of itchy, burning, painful blisters on the posterior neck, occipital area, back, and fingers

## 2013-07-26 ENCOUNTER — Emergency Department (HOSPITAL_COMMUNITY): Payer: Self-pay

## 2013-07-26 ENCOUNTER — Encounter (HOSPITAL_COMMUNITY): Payer: Self-pay | Admitting: Emergency Medicine

## 2013-07-26 ENCOUNTER — Emergency Department (HOSPITAL_COMMUNITY)
Admission: EM | Admit: 2013-07-26 | Discharge: 2013-07-26 | Disposition: A | Discharge status: Home / Self Care | Payer: Self-pay | Attending: Emergency Medicine | Admitting: Emergency Medicine

## 2013-07-26 DIAGNOSIS — M25512 Pain in left shoulder: Secondary | ICD-10-CM

## 2013-07-26 DIAGNOSIS — Z8719 Personal history of other diseases of the digestive system: Secondary | ICD-10-CM | POA: Insufficient documentation

## 2013-07-26 DIAGNOSIS — M25519 Pain in unspecified shoulder: Secondary | ICD-10-CM | POA: Insufficient documentation

## 2013-07-26 DIAGNOSIS — Z8739 Personal history of other diseases of the musculoskeletal system and connective tissue: Secondary | ICD-10-CM | POA: Insufficient documentation

## 2013-07-26 DIAGNOSIS — M79609 Pain in unspecified limb: Secondary | ICD-10-CM | POA: Insufficient documentation

## 2013-07-26 MED ORDER — OXYCODONE-ACETAMINOPHEN 5-325 MG PO TABS: 1.0000 | ORAL_TABLET | Freq: Four times a day (QID) | ORAL | Status: DC | PRN

## 2013-07-26 MED ORDER — CYCLOBENZAPRINE HCL 10 MG PO TABS: 5.0000 mg | ORAL_TABLET | Freq: Two times a day (BID) | ORAL | Status: DC | PRN

## 2013-07-26 MED ORDER — OXYCODONE-ACETAMINOPHEN 5-325 MG PO TABS
2.0000 | ORAL_TABLET | Freq: Once | ORAL | Status: AC
  Administered 2013-07-26: 2 via ORAL
  Filled 2013-07-26: qty 2

## 2013-07-26 NOTE — ED Notes (Signed)
Pt presents to department for evaluation of L shoulder and L arm pain. States she heard something "pop" this morning at home. 9/10 pain upon arrival to ED. Able to wiggle digits. Pulses present to L arm. Pain increases with movement. Pt is alert and oriented x4.

## 2013-07-26 NOTE — ED Provider Notes (Signed)
CSN: [id number]     Arrival date & time 07/26/13  0957 History   First MD Initiated Contact with Patient 07/26/13 1007     Chief Complaint  Patient presents with  . Shoulder Pain  . Arm Pain     (Consider location/radiation/quality/duration/timing/severity/associated sxs/prior Treatment) Patient is a 53 y.o. female presenting with shoulder pain and arm pain. The history is provided by the patient.  Shoulder Pain This is a new problem. Episode onset: 1 week ago. The problem occurs constantly. The problem has been gradually improving. Pertinent negatives include no chest pain, no abdominal pain, no headaches and no shortness of breath. Exacerbated by: movement of the shoulder. Nothing relieves the symptoms. She has tried nothing for the symptoms. The treatment provided mild relief.  Arm Pain Pertinent negatives include no chest pain, no abdominal pain, no headaches and no shortness of breath.    Past Medical History  Diagnosis Date  . Pancreatitis   . Chronic fatigue   . Trigeminal neuralgia   . Fibromyalgia    Past Surgical History  Procedure Laterality Date  . Abdominal hysterectomy    . Cesarean section     Family History  Problem Relation Age of Onset  . Diabetes Mother   . Coronary artery disease Father   . Cancer Daughter   . Cancer Son    History  Substance Use Topics  . Smoking status: Never Smoker   . Smokeless tobacco: Not on file  . Alcohol Use: No   OB History   Grav Para Term Preterm Abortions TAB SAB Ect Mult Living                 Review of Systems  Constitutional: Negative for fever and fatigue.  HENT: Negative for congestion and drooling.   Eyes: Negative for pain.  Respiratory: Negative for cough and shortness of breath.   Cardiovascular: Negative for chest pain.  Gastrointestinal: Negative for nausea, vomiting, abdominal pain and diarrhea.  Genitourinary: Negative for dysuria and hematuria.  Musculoskeletal: Negative for back pain, gait problem  and neck pain.  Skin: Negative for color change.  Neurological: Negative for dizziness and headaches.  Hematological: Negative for adenopathy.  Psychiatric/Behavioral: Negative for behavioral problems.  All other systems reviewed and are negative.     Allergies  Doxycycline  Home Medications  No current outpatient prescriptions on file. BP 149/92  Pulse 85  Temp(Src) 98 F (36.7 C) (Oral)  Resp 18  SpO2 96% Physical Exam  Nursing note and vitals reviewed. Constitutional: She is oriented to person, place, and time. She appears well-developed and well-nourished.  HENT:  Head: Normocephalic and atraumatic.  Nose: Nose normal.  Mouth/Throat: Oropharynx is clear and moist. No oropharyngeal exudate.  Eyes: Conjunctivae and EOM are normal. Pupils are equal, round, and reactive to light.  Neck: Normal range of motion. Neck supple.  Cardiovascular: Normal rate, regular rhythm, normal heart sounds and intact distal pulses.  Exam reveals no gallop and no friction rub.   No murmur heard. Pulmonary/Chest: Effort normal and breath sounds normal. No respiratory distress. She has no wheezes.  Abdominal: Soft. Bowel sounds are normal. There is no tenderness. There is no rebound and no guarding.  Musculoskeletal: Normal range of motion. She exhibits tenderness (tenderness to palpation of the left proximal humerus and left lateral shoulder. Moderately limited range of motion do to pain at the left shoulder.). She exhibits no edema.  Normal range of motion of the left hand and the digits of the left  hand.  2+ distal pulses in upper extremities.  Mildly altered sensation to light touch of the deltoid area of the left arm.  Neurological: She is alert and oriented to person, place, and time.  Skin: Skin is warm and dry.  Psychiatric: She has a normal mood and affect. Her behavior is normal.    ED Course  Procedures (including critical care time) Labs Review Labs Reviewed - No data to  display Imaging Review Dg Shoulder Left  07/26/2013   CLINICAL DATA:  Left shoulder pain.  EXAM: LEFT SHOULDER - 2+ VIEW  COMPARISON:  None.  FINDINGS: No acute bony or joint abnormality is identified. No notable degenerative disease. Imaged lung parenchyma and ribs appear normal.  IMPRESSION: Negative exam.   Electronically Signed   By: Drusilla Kannerhomas  Dalessio M.D.   On: 07/26/2013 10:42   Dg Humerus Left  07/26/2013   CLINICAL DATA:  Left upper arm pain.  EXAM: LEFT HUMERUS - 2+ VIEW  COMPARISON:  None.  FINDINGS: Imaged bones, joints and soft tissues appear normal.  IMPRESSION: Negative exam.   Electronically Signed   By: Drusilla Kannerhomas  Dalessio M.D.   On: 07/26/2013 10:42     EKG Interpretation None      MDM   Final diagnoses:  Left shoulder pain    10:25 AM 53 y.o. female who presents with left shoulder pain which began approximately one week ago. She states she was picking up a trash bag when she felt a pop in her left shoulder one week ago. She notes the pain has been resolving until this morning when she was putting dishes in her strength and felt another pop in the left shoulder. She notes worsening pain since that time. She denies any fevers, vomiting, diarrhea. She is afebrile and vital signs are unremarkable here. Will get screening imaging and pain control Percocet.  11:58 AM: I interpreted/reviewed the labs and/or imaging which were non-contributory.  Pt w/ mild improvement. Able to range her right shoulder. Will provide sling to use intermittently for comfort w/ precautions about frozen shoulder. I have discussed the diagnosis/risks/treatment options with the patient and believe the pt to be eligible for discharge home to follow-up with sports med as needed. We also discussed returning to the ED immediately if new or worsening sx occur. We discussed the sx which are most concerning (e.g., worsening pain, fever) that necessitate immediate return. Medications administered to the patient during  their visit and any new prescriptions provided to the patient are listed below.  Medications given during this visit Medications  oxyCODONE-acetaminophen (PERCOCET/ROXICET) 5-325 MG per tablet 2 tablet (2 tablets Oral Given 07/26/13 1029)    New Prescriptions   CYCLOBENZAPRINE (FLEXERIL) 10 MG TABLET    Take 0.5 tablets (5 mg total) by mouth 2 (two) times daily as needed for muscle spasms.   OXYCODONE-ACETAMINOPHEN (PERCOCET) 5-325 MG PER TABLET    Take 1-2 tablets by mouth every 6 (six) hours as needed for moderate pain.     Junius ArgyleForrest S Abdou Stocks, MD 07/26/13 1201

## 2013-07-26 NOTE — ED Notes (Signed)
Pt transported to xray 

## 2013-07-26 NOTE — Discharge Instructions (Signed)
Arthralgia  Arthralgia is joint pain. A joint is a place where two bones meet. Joint pain can happen for many reasons. The joint can be bruised, stiff, infected, or weak from aging. Pain usually goes away after resting and taking medicine for soreness.   HOME CARE  · Rest the joint as told by your doctor.  · Keep the sore joint raised (elevated) for the first 24 hours.  · Put ice on the joint area.  · Put ice in a plastic bag.  · Place a towel between your skin and the bag.  · Leave the ice on for 15-20 minutes, 03-04 times a day.  · Wear your splint, casting, elastic bandage, or sling as told by your doctor.  · Only take medicine as told by your doctor. Do not take aspirin.  · Use crutches as told by your doctor. Do not put weight on the joint until told to by your doctor.  GET HELP RIGHT AWAY IF:   · You have bruising, puffiness (swelling), or more pain.  · Your fingers or toes turn blue or start to lose feeling (numb).  · Your medicine does not lessen the pain.  · Your pain becomes severe.  · You have a temperature by mouth above 102° F (38.9° C), not controlled by medicine.  · You cannot move or use the joint.  MAKE SURE YOU:   · Understand these instructions.  · Will watch your condition.  · Will get help right away if you are not doing well or get worse.  Document Released: 03/25/2009 Document Revised: 06/29/2011 Document Reviewed: 03/25/2009  [employer]® Patient Information ©2014 [employer].

## 2015-02-15 ENCOUNTER — Emergency Department (HOSPITAL_COMMUNITY): Payer: Self-pay

## 2015-02-15 ENCOUNTER — Encounter (HOSPITAL_COMMUNITY): Payer: Self-pay | Admitting: *Deleted

## 2015-02-15 ENCOUNTER — Emergency Department (HOSPITAL_COMMUNITY)
Admission: EM | Admit: 2015-02-15 | Discharge: 2015-02-16 | Disposition: A | Discharge status: Home / Self Care | Payer: Self-pay | Attending: Physician Assistant | Admitting: Physician Assistant

## 2015-02-15 DIAGNOSIS — Z8719 Personal history of other diseases of the digestive system: Secondary | ICD-10-CM | POA: Insufficient documentation

## 2015-02-15 DIAGNOSIS — S8991XA Unspecified injury of right lower leg, initial encounter: Secondary | ICD-10-CM | POA: Insufficient documentation

## 2015-02-15 DIAGNOSIS — M797 Fibromyalgia: Secondary | ICD-10-CM | POA: Insufficient documentation

## 2015-02-15 DIAGNOSIS — Y92093 Driveway of other non-institutional residence as the place of occurrence of the external cause: Secondary | ICD-10-CM | POA: Insufficient documentation

## 2015-02-15 DIAGNOSIS — W19XXXA Unspecified fall, initial encounter: Secondary | ICD-10-CM

## 2015-02-15 DIAGNOSIS — Z8669 Personal history of other diseases of the nervous system and sense organs: Secondary | ICD-10-CM | POA: Insufficient documentation

## 2015-02-15 DIAGNOSIS — W01198A Fall on same level from slipping, tripping and stumbling with subsequent striking against other object, initial encounter: Secondary | ICD-10-CM | POA: Insufficient documentation

## 2015-02-15 DIAGNOSIS — S0992XA Unspecified injury of nose, initial encounter: Secondary | ICD-10-CM | POA: Insufficient documentation

## 2015-02-15 DIAGNOSIS — Z23 Encounter for immunization: Secondary | ICD-10-CM | POA: Insufficient documentation

## 2015-02-15 DIAGNOSIS — S0990XA Unspecified injury of head, initial encounter: Secondary | ICD-10-CM | POA: Insufficient documentation

## 2015-02-15 DIAGNOSIS — Z79899 Other long term (current) drug therapy: Secondary | ICD-10-CM | POA: Insufficient documentation

## 2015-02-15 DIAGNOSIS — Y998 Other external cause status: Secondary | ICD-10-CM | POA: Insufficient documentation

## 2015-02-15 DIAGNOSIS — S8992XA Unspecified injury of left lower leg, initial encounter: Secondary | ICD-10-CM | POA: Insufficient documentation

## 2015-02-15 DIAGNOSIS — S3992XA Unspecified injury of lower back, initial encounter: Secondary | ICD-10-CM | POA: Insufficient documentation

## 2015-02-15 DIAGNOSIS — Y9389 Activity, other specified: Secondary | ICD-10-CM | POA: Insufficient documentation

## 2015-02-15 MED ORDER — OXYCODONE-ACETAMINOPHEN 5-325 MG PO TABS
1.0000 | ORAL_TABLET | Freq: Once | ORAL | Status: AC
  Administered 2015-02-15: 1 via ORAL
  Filled 2015-02-15: qty 1

## 2015-02-15 NOTE — ED Notes (Signed)
Pt tripped over power cord in driveway and went face first into concrete,  Denies LOC,  Has face pain,  Swollen nose and forehead,  Back pain and bilateral knee pain,   A lot of stiffness she states

## 2015-02-15 NOTE — ED Provider Notes (Signed)
CSN: [id number]     Arrival date & time 02/15/15  2204 History  By signing my name below, I, Phillis Haggis, attest that this documentation has been prepared under the direction and in the presence of Sandra Tellefsen Randall An, MD. Electronically Signed: Phillis Haggis, ED Scribe. 02/15/2015. 12:22 AM.     Chief Complaint  Patient presents with  . Fall   The history is provided by the patient. No language interpreter was used.  HPI Comments: Satara Virella is a 54 y.o. Female with a hx of fibromyalgia who presents to the Emergency Department complaining of a fall onset 1 hour ago. Pt states that she tripped in her driveway, hitting her face into the concrete. She reports associated facial pain and swelling to the nose and forehead, bilateral knee pain and lower back pain. She denies LOC or being on blood thinners. She denies allergies to medications.   Past Medical History  Diagnosis Date  . Pancreatitis   . Chronic fatigue   . Trigeminal neuralgia   . Fibromyalgia    Past Surgical History  Procedure Laterality Date  . Abdominal hysterectomy    . Cesarean section     Family History  Problem Relation Age of Onset  . Diabetes Mother   . Coronary artery disease Father   . Cancer Daughter   . Cancer Son    Social History  Substance Use Topics  . Smoking status: Never Smoker   . Smokeless tobacco: None  . Alcohol Use: No   OB History    No data available     Review of Systems  HENT: Positive for facial swelling.   Musculoskeletal: Positive for back pain and arthralgias.  Neurological: Negative for syncope.  All other systems reviewed and are negative.  Allergies  Doxycycline  Home Medications   Prior to Admission medications   Medication Sig Start Date End Date Taking? Authorizing Provider  cyclobenzaprine (FLEXERIL) 10 MG tablet Take 0.5 tablets (5 mg total) by mouth 2 (two) times daily as needed for muscle spasms. 07/26/13   Purvis Sheffield, MD  methadone (DOLOPHINE)  10 MG/ML solution Take 220 mg by mouth daily.    Historical Provider, MD  oxyCODONE-acetaminophen (PERCOCET) 5-325 MG per tablet Take 1-2 tablets by mouth every 6 (six) hours as needed for moderate pain. 07/26/13   Purvis Sheffield, MD   BP 126/77 mmHg  Pulse 94  Temp(Src) 98.3 F (36.8 C) (Oral)  Resp 18  SpO2 99%  Physical Exam  Constitutional: She is oriented to person, place, and time. She appears well-developed and well-nourished.  HENT:  Head: Normocephalic.  Swelling of bilateral nose; normal bite; no septal hematoma  Eyes: EOM are normal. Pupils are equal, round, and reactive to light.  Neck: Normal range of motion. Neck supple.  Cardiovascular: Normal rate, regular rhythm and normal heart sounds.  Exam reveals no gallop and no friction rub.   No murmur heard. Pulmonary/Chest: Effort normal and breath sounds normal. She has no wheezes.  Abdominal: Soft. There is no tenderness.  Musculoskeletal: Normal range of motion.  No C-spine tenderness on palpation; right paraspinal tenderness  Neurological: She is alert and oriented to person, place, and time.  Skin: Skin is warm and dry.  Psychiatric: She has a normal mood and affect. Her behavior is normal.  Nursing note and vitals reviewed.   ED Course  Procedures (including critical care time) DIAGNOSTIC STUDIES: Oxygen Saturation is 99% on RA, normal by my interpretation.    COORDINATION OF CARE: 11:16  PM-Discussed treatment plan which includes CT scan with pt at bedside and pt agreed to plan.   Labs Review Labs Reviewed - No data to display  Imaging Review Dg Lumbar Spine Complete  02/16/2015  CLINICAL DATA:  Tripped and fell, with lower back pain. Initial encounter. EXAM: LUMBAR SPINE - COMPLETE 4+ VIEW COMPARISON:  CT of the abdomen and pelvis performed 08/17/2005 FINDINGS: There is no evidence of fracture or subluxation. Vertebral bodies demonstrate normal height and alignment. Intervertebral disc spaces are preserved.  The visualized neural foramina are grossly unremarkable in appearance. The visualized bowel gas pattern is unremarkable in appearance; air and stool are noted within the colon. The sacroiliac joints are within normal limits. IMPRESSION: No evidence of fracture or subluxation along the lumbar spine. Electronically Signed   By: Roanna RaiderJeffery  Chang M.D.   On: 02/16/2015 00:03   Ct Head Wo Contrast  02/15/2015  CLINICAL DATA:  Trauma, fall face first onto driveway, facial pain, nasal swelling EXAM: CT HEAD WITHOUT CONTRAST CT MAXILLOFACIAL WITHOUT CONTRAST CT CERVICAL SPINE WITHOUT CONTRAST TECHNIQUE: Multidetector CT imaging of the head, cervical spine, and maxillofacial structures were performed using the standard protocol without intravenous contrast. Multiplanar CT image reconstructions of the cervical spine and maxillofacial structures were also generated. COMPARISON:  Sinus CT dated 09/15/2004 FINDINGS: CT HEAD FINDINGS No evidence of parenchymal hemorrhage or extra-axial fluid collection. No mass lesion, mass effect, or midline shift. No CT evidence of acute infarction. Cerebral volume is within normal limits.  No ventriculomegaly. The visualized paranasal sinuses are essentially clear. The mastoid air cells are unopacified. No evidence of calvarial fracture. CT MAXILLOFACIAL FINDINGS Mild soft tissue swelling overlying the right frontal bone (series 3/ image 54) and nasal bridge (series 3/image 59). No evidence of maxillofacial fracture. The bilateral orbits, including the globes and retroconal soft tissues, are within normal limits. The visualized paranasal sinuses are essentially clear. The mastoid air cells are unopacified. The mandible is intact. The bilateral mandibular condyles are well-seated in the TMJs. CT CERVICAL SPINE FINDINGS Reversal of the normal cervical lordosis. No evidence of fracture or dislocation. Vertebral body heights are maintained. Dens appears intact. No prevertebral soft tissue  swelling. Mild multilevel degenerative changes. Visualized thyroid is unremarkable. Visualized lung apices are clear. IMPRESSION: Normal head CT. Mild soft tissue swelling overlying the right frontal bone and nasal bridge. No evidence of maxillofacial fracture. No evidence of traumatic injury to the cervical spine. Mild degenerative changes. Electronically Signed   By: Charline BillsSriyesh  Krishnan M.D.   On: 02/15/2015 23:58   Ct Cervical Spine Wo Contrast  02/15/2015  CLINICAL DATA:  Trauma, fall face first onto driveway, facial pain, nasal swelling EXAM: CT HEAD WITHOUT CONTRAST CT MAXILLOFACIAL WITHOUT CONTRAST CT CERVICAL SPINE WITHOUT CONTRAST TECHNIQUE: Multidetector CT imaging of the head, cervical spine, and maxillofacial structures were performed using the standard protocol without intravenous contrast. Multiplanar CT image reconstructions of the cervical spine and maxillofacial structures were also generated. COMPARISON:  Sinus CT dated 09/15/2004 FINDINGS: CT HEAD FINDINGS No evidence of parenchymal hemorrhage or extra-axial fluid collection. No mass lesion, mass effect, or midline shift. No CT evidence of acute infarction. Cerebral volume is within normal limits.  No ventriculomegaly. The visualized paranasal sinuses are essentially clear. The mastoid air cells are unopacified. No evidence of calvarial fracture. CT MAXILLOFACIAL FINDINGS Mild soft tissue swelling overlying the right frontal bone (series 3/ image 54) and nasal bridge (series 3/image 59). No evidence of maxillofacial fracture. The bilateral orbits, including the globes and  retroconal soft tissues, are within normal limits. The visualized paranasal sinuses are essentially clear. The mastoid air cells are unopacified. The mandible is intact. The bilateral mandibular condyles are well-seated in the TMJs. CT CERVICAL SPINE FINDINGS Reversal of the normal cervical lordosis. No evidence of fracture or dislocation. Vertebral body heights are maintained.  Dens appears intact. No prevertebral soft tissue swelling. Mild multilevel degenerative changes. Visualized thyroid is unremarkable. Visualized lung apices are clear. IMPRESSION: Normal head CT. Mild soft tissue swelling overlying the right frontal bone and nasal bridge. No evidence of maxillofacial fracture. No evidence of traumatic injury to the cervical spine. Mild degenerative changes. Electronically Signed   By: Charline Bills M.D.   On: 02/15/2015 23:58   Ct Maxillofacial Wo Cm  02/15/2015  CLINICAL DATA:  Trauma, fall face first onto driveway, facial pain, nasal swelling EXAM: CT HEAD WITHOUT CONTRAST CT MAXILLOFACIAL WITHOUT CONTRAST CT CERVICAL SPINE WITHOUT CONTRAST TECHNIQUE: Multidetector CT imaging of the head, cervical spine, and maxillofacial structures were performed using the standard protocol without intravenous contrast. Multiplanar CT image reconstructions of the cervical spine and maxillofacial structures were also generated. COMPARISON:  Sinus CT dated 09/15/2004 FINDINGS: CT HEAD FINDINGS No evidence of parenchymal hemorrhage or extra-axial fluid collection. No mass lesion, mass effect, or midline shift. No CT evidence of acute infarction. Cerebral volume is within normal limits.  No ventriculomegaly. The visualized paranasal sinuses are essentially clear. The mastoid air cells are unopacified. No evidence of calvarial fracture. CT MAXILLOFACIAL FINDINGS Mild soft tissue swelling overlying the right frontal bone (series 3/ image 54) and nasal bridge (series 3/image 59). No evidence of maxillofacial fracture. The bilateral orbits, including the globes and retroconal soft tissues, are within normal limits. The visualized paranasal sinuses are essentially clear. The mastoid air cells are unopacified. The mandible is intact. The bilateral mandibular condyles are well-seated in the TMJs. CT CERVICAL SPINE FINDINGS Reversal of the normal cervical lordosis. No evidence of fracture or  dislocation. Vertebral body heights are maintained. Dens appears intact. No prevertebral soft tissue swelling. Mild multilevel degenerative changes. Visualized thyroid is unremarkable. Visualized lung apices are clear. IMPRESSION: Normal head CT. Mild soft tissue swelling overlying the right frontal bone and nasal bridge. No evidence of maxillofacial fracture. No evidence of traumatic injury to the cervical spine. Mild degenerative changes. Electronically Signed   By: Charline Bills M.D.   On: 02/15/2015 23:58   I have personally reviewed and evaluated these images and lab results as part of my medical decision-making.   EKG Interpretation None      MDM   Final diagnoses:  None     Patient is a 54 year old female presenting after fall. Patient had mechanical fall today. Patient fell forward striking her nose. Patient has significant swelling and tenderness. No septal hematoma. Extraocular movements intact.  Plan to CT head neck and face. And get a plain film of her L-spine. Anticipate nasal bone fracture. Swelling will preclude any reduction here in the emergency department.  No fracture. Will send home with pain medication.    I personally performed the services described in this documentation, which was scribed in my presence. The recorded information has been reviewed and is accurate.   '   Kashmere Daywalt Randall An, MD 02/16/15 [phone]

## 2015-02-16 MED ORDER — TETANUS-DIPHTH-ACELL PERTUSSIS 5-2.5-18.5 LF-MCG/0.5 IM SUSP
0.5000 mL | Freq: Once | INTRAMUSCULAR | Status: AC
  Administered 2015-02-16: 0.5 mL via INTRAMUSCULAR
  Filled 2015-02-16: qty 0.5

## 2015-02-16 MED ORDER — OXYCODONE-ACETAMINOPHEN 5-325 MG PO TABS: 1.0000 | ORAL_TABLET | Freq: Four times a day (QID) | ORAL | Status: DC | PRN

## 2015-02-16 NOTE — Discharge Instructions (Signed)
You were seen today after mechanical fall. There were no fractures noted. You did have swelling to your nose. Please sleep with her head elevated otherwise the swelling will get worse. Use ice compresses to help with the pain.

## 2015-08-06 ENCOUNTER — Emergency Department
Admission: EM | Admit: 2015-08-06 | Discharge: 2015-08-06 | Disposition: A | Discharge status: Home / Self Care | Payer: Self-pay | Attending: Emergency Medicine | Admitting: Emergency Medicine

## 2015-08-06 ENCOUNTER — Encounter: Payer: Self-pay | Admitting: Emergency Medicine

## 2015-08-06 DIAGNOSIS — Z8669 Personal history of other diseases of the nervous system and sense organs: Secondary | ICD-10-CM

## 2015-08-06 DIAGNOSIS — R5383 Other fatigue: Secondary | ICD-10-CM | POA: Insufficient documentation

## 2015-08-06 DIAGNOSIS — Z90710 Acquired absence of both cervix and uterus: Secondary | ICD-10-CM | POA: Insufficient documentation

## 2015-08-06 DIAGNOSIS — G43909 Migraine, unspecified, not intractable, without status migrainosus: Secondary | ICD-10-CM | POA: Insufficient documentation

## 2015-08-06 MED ORDER — BUTALBITAL-APAP-CAFFEINE 50-325-40 MG PO TABS: 1.0000 | ORAL_TABLET | Freq: Four times a day (QID) | ORAL | Status: DC | PRN

## 2015-08-06 MED ORDER — CYCLOBENZAPRINE HCL 5 MG PO TABS: 5.0000 mg | ORAL_TABLET | Freq: Three times a day (TID) | ORAL | Status: DC | PRN

## 2015-08-06 MED ORDER — BUTALBITAL-APAP-CAFFEINE 50-325-40 MG PO TABS
2.0000 | ORAL_TABLET | Freq: Once | ORAL | Status: AC
  Administered 2015-08-06: 2 via ORAL
  Filled 2015-08-06: qty 2

## 2015-08-06 MED ORDER — ONDANSETRON 4 MG PO TBDP
4.0000 mg | ORAL_TABLET | Freq: Once | ORAL | Status: AC
  Administered 2015-08-06: 4 mg via ORAL
  Filled 2015-08-06: qty 1

## 2015-08-06 MED ORDER — HYDROMORPHONE HCL 1 MG/ML IJ SOLN
1.0000 mg | Freq: Once | INTRAMUSCULAR | Status: AC
  Administered 2015-08-06: 1 mg via INTRAMUSCULAR
  Filled 2015-08-06: qty 1

## 2015-08-06 NOTE — ED Provider Notes (Signed)
[facility] Emergency Department Provider Note  ____________________________________________  Time seen: Approximately 12:46 PM  I have reviewed the triage vital signs and the nursing notes.   HISTORY  Chief Complaint Headache    HPI Mia Bartlett is a 55 y.o. female presents for evaluation of a headache for 13 days. Patient states that she goes to the methadone clinic daily receiving 200 mg of methadone per day for "fibromyalgia and previous substance abuse." Patient is also requesting EKG for some minimal chest pains are nonradiating. Patient states that she is to have the EKG nor to return to the clinic.   Past Medical History  Diagnosis Date  . Pancreatitis   . Chronic fatigue   . Trigeminal neuralgia   . Fibromyalgia     There are no active problems to display for this patient.   Past Surgical History  Procedure Laterality Date  . Abdominal hysterectomy    . Cesarean section      Current Outpatient Rx  Name  Route  Sig  Dispense  Refill  . butalbital-acetaminophen-caffeine (FIORICET) 50-325-40 MG tablet   Oral   Take 1-2 tablets by mouth every 6 (six) hours as needed for headache.   20 tablet   0   . cyclobenzaprine (FLEXERIL) 5 MG tablet   Oral   Take 1 tablet (5 mg total) by mouth every 8 (eight) hours as needed for muscle spasms.   30 tablet   0   . methadone (DOLOPHINE) 10 MG/ML solution   Oral   Take 200 mg by mouth daily.            Allergies Doxycycline  Family History  Problem Relation Age of Onset  . Diabetes Mother   . Coronary artery disease Father   . Cancer Daughter   . Cancer Son     Social History Social History  Substance Use Topics  . Smoking status: Never Smoker   . Smokeless tobacco: None  . Alcohol Use: No    Review of Systems Constitutional: No fever/chills Eyes: No visual changes. ENT: No sore throat. Cardiovascular: Positive for occasional chest  pains. Respiratory: Denies shortness  of breath. Gastrointestinal: No abdominal pain.  No nausea, no vomiting.  No diarrhea.  No constipation. Genitourinary: Negative for dysuria. Musculoskeletal: Negative for back pain. Skin: Negative for rash. Neurological: Positive for headaches, focal weakness or numbness.  10-point ROS otherwise negative.  ____________________________________________   PHYSICAL EXAM:  VITAL SIGNS: ED Triage Vitals  Enc Vitals Group     BP 08/06/15 1146 124/77 mmHg     Pulse Rate 08/06/15 1146 79     Resp 08/06/15 1146 20     Temp 08/06/15 1146 98.6 F (37 C)     Temp Source 08/06/15 1146 Oral     SpO2 08/06/15 1146 97 %     Weight 08/06/15 1146 135 lb (61.236 kg)     Height 08/06/15 1146 5\' 4"  (1.626 m)     Head Cir --      Peak Flow --      Pain Score 08/06/15 1149 10     Pain Loc --      Pain Edu? --      Excl. in GC? --     Constitutional: Alert and oriented. Well appearing and in no acute distress. Eyes: Conjunctivae are normal. PERRL. EOMI.Funduscopy unremarkable. Head: Atraumatic. Nose: No congestion/rhinnorhea. Mouth/Throat: Mucous membranes are moist.  Oropharynx non-erythematous. Neck: No stridor.   Cardiovascular: Normal rate, regular rhythm. Grossly  normal heart sounds.  Good peripheral circulation. Respiratory: Normal respiratory effort.  No retractions. Lungs CTAB. Gastrointestinal: Soft and nontender. No distention. No abdominal bruits. No CVA tenderness. Musculoskeletal: No lower extremity tenderness nor edema.  No joint effusions. Neurologic:  Normal speech and language. No gross focal neurologic deficits are appreciated. No gait instability. Skin:  Skin is warm, dry and intact. No rash noted. Psychiatric: Mood and affect are normal. Speech and behavior are normal.  ____________________________________________   LABS (all labs ordered are listed, but only abnormal results are displayed)  Labs Reviewed - No data to  display ____________________________________________  EKG Normal sinus rhythm no acute changes. No STEMI.    PROCEDURES  Procedure(s) performed: None  Critical Care performed: No  ____________________________________________   INITIAL IMPRESSION / ASSESSMENT AND PLAN / ED COURSE  Pertinent labs & imaging results that were available during my care of the patient were reviewed by me and considered in my medical decision making (see chart for details).  Patient initially given Fioricet 2 tablets and Dilaudid 1 mg IM upon arrival. Patient reports improvement in her headache. Discharged home with a diagnosis of Recurrent headaches. Chronic pain syndrome. Fibromyalgia by history. Rx given for Fioricet one to 2 every 4-6 hours, Flexeril 5 mg 3 times a day. Patient follow-up with PCP or return to the ER with worsening symptomology. ____________________________________________   FINAL CLINICAL IMPRESSION(S) / ED DIAGNOSES  Final diagnoses:  Hx of migraine headaches     This chart was dictated using voice recognition software/Dragon. Despite best efforts to proofread, errors can occur which can change the meaning. Any change was purely unintentional.   Evangeline Dakin, PA-C 08/06/15 1509  Myrna Blazer, MD 08/06/15 1630

## 2015-08-06 NOTE — ED Notes (Addendum)
Pt to ed with c /o headache x 13 days.  Pt states she has tried tylenol, ibu, goody's headache relief, nyquil without relief.  Pt states she is a pt at local methadone clinic.

## 2015-08-06 NOTE — ED Notes (Addendum)
Pt also reporting that she visits a methadone treatment center 200mg  qd - and the methadone clinic is wanting her to get an ecg before Friday or they are going to lower her dose.

## 2016-10-21 IMAGING — CT CT HEAD W/O CM
5 of 9 series · 17 of 47 positions shown, 18 images · non-contrast
Comparison: Sinus CT dated 09/15/2004

CLINICAL DATA: Trauma, fall face first onto driveway, facial pain,
nasal swelling

EXAM:
CT HEAD WITHOUT CONTRAST
CT MAXILLOFACIAL WITHOUT CONTRAST
CT CERVICAL SPINE WITHOUT CONTRAST
TECHNIQUE: Multidetector CT imaging of the head, cervical spine, and
maxillofacial structures were performed using the standard protocol
without intravenous contrast. Multiplanar CT image reconstructions
of the cervical spine and maxillofacial structures were also
generated.

[Series 3: facial st · axial · 0.36mm/px · z∈[-359,-217]mm · 3 of 72 slices shown, 4 images]
[im 1/72  brain]
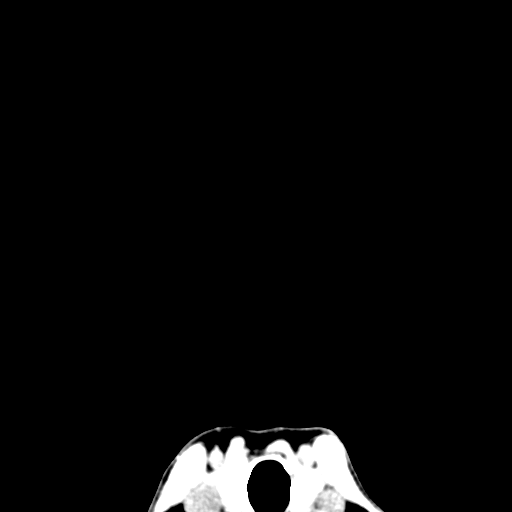
[im 1/72  bone]
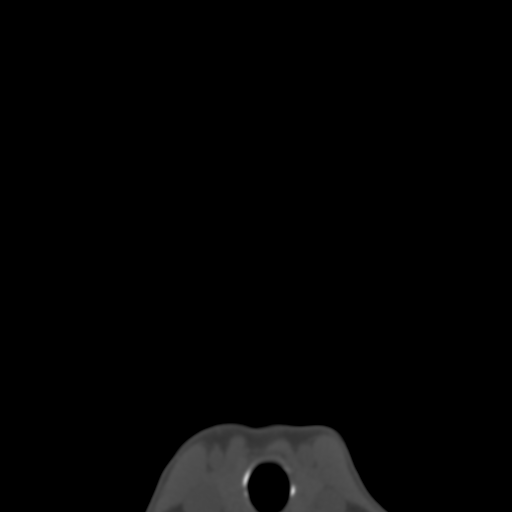
[im 36/72  brain]
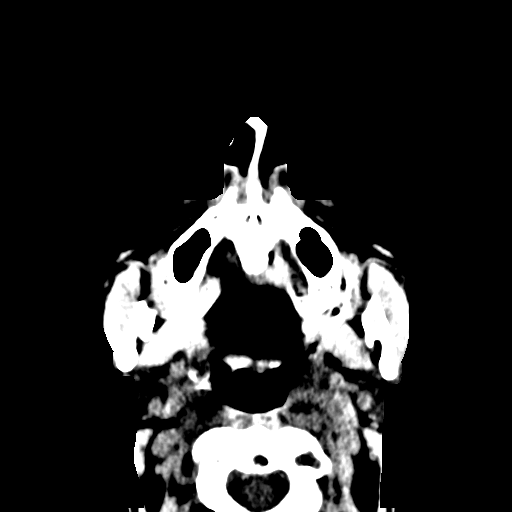
[im 72/72  brain]
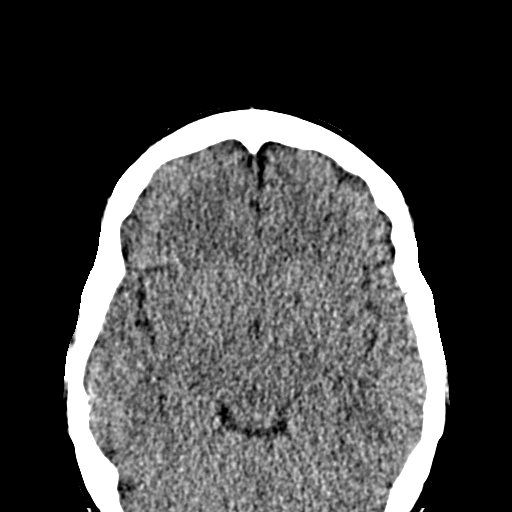

[Series 9: c-spine st · axial · 0.25mm/px · z∈[-337,-291]mm · 2 of 71 slices shown]
[im 24/71  brain]
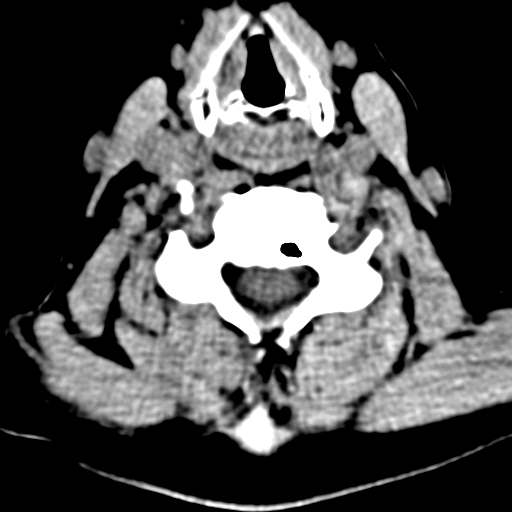
[im 47/71  brain]
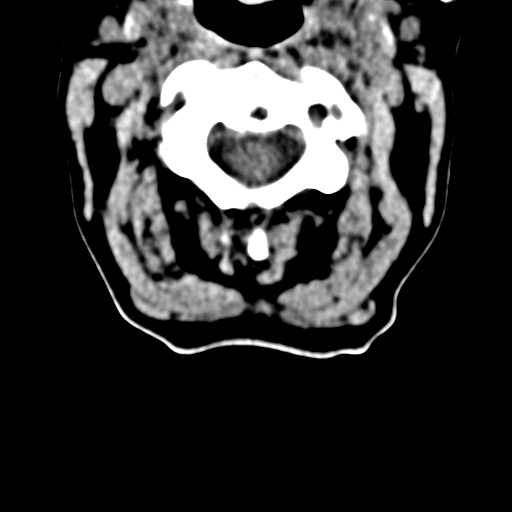

[Series 13: sagittal st · sagittal · 0.29mm/px · 2 of 76 slices shown]
[im 26/76  brain]
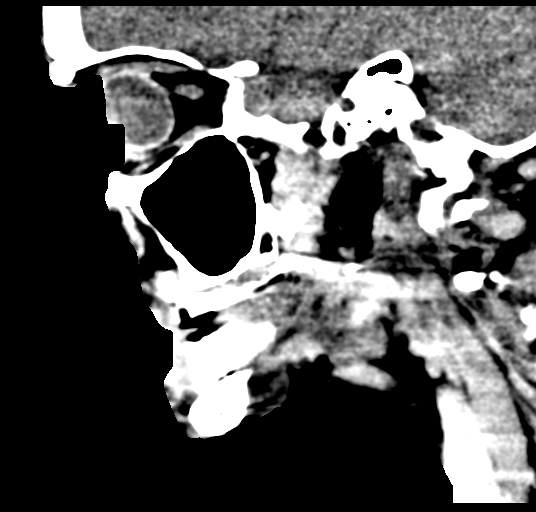
[im 51/76  brain]
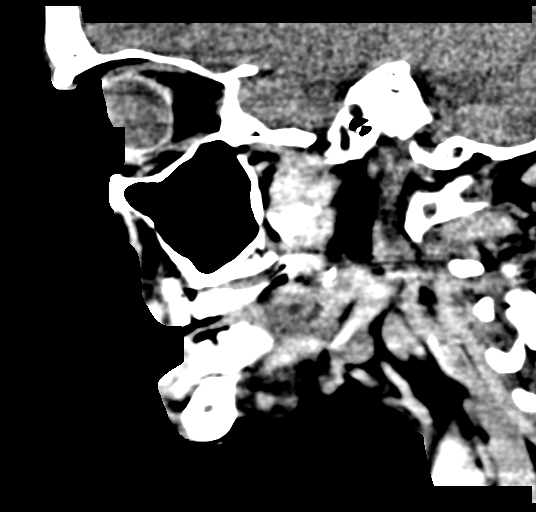

[Series 16: coronal · coronal · 0.23mm/px · 2 of 61 slices shown]
[im 21/61  brain]
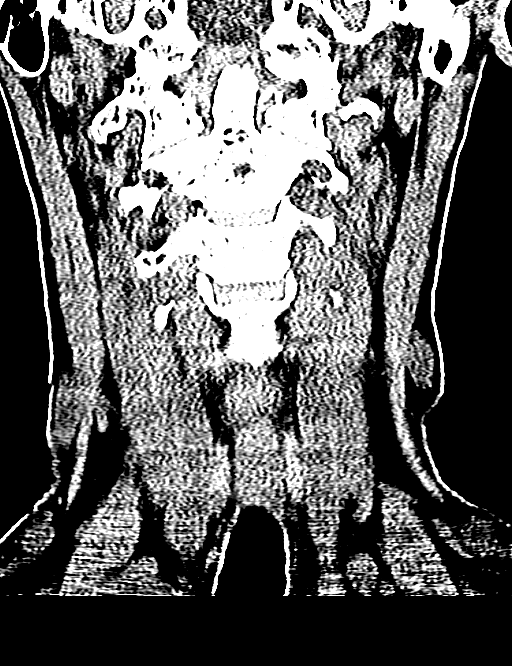
[im 41/61  brain]
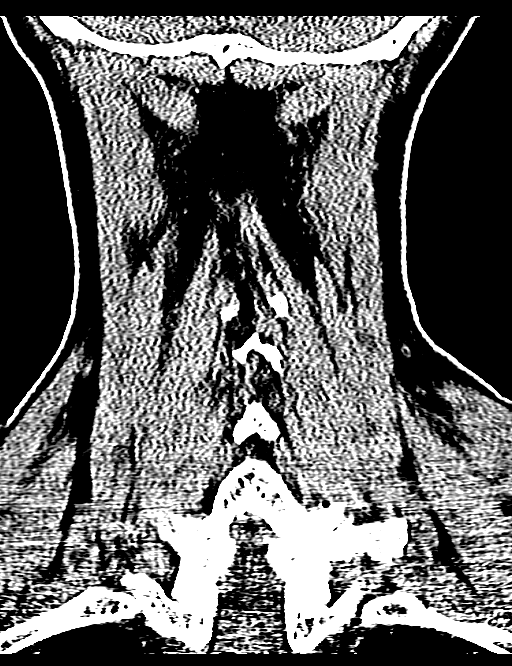

[Series 18: axial · axial · 0.23mm/px · z∈[-392,-286]mm · 8 of 375 slices shown]
[im 42/375  brain]
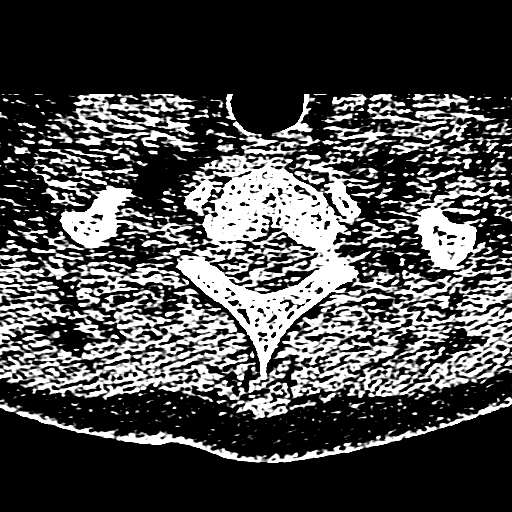
[im 84/375  brain]
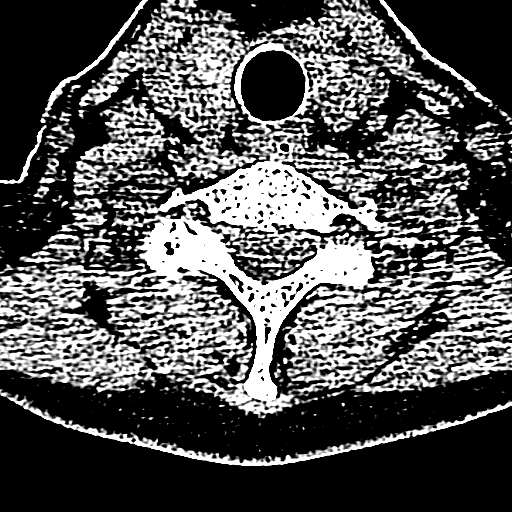
[im 125/375  brain]
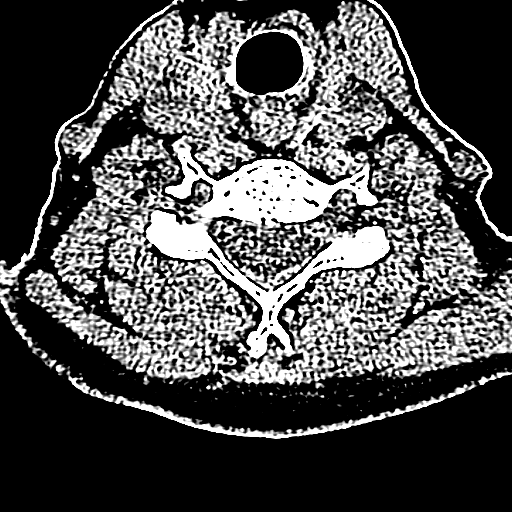
[im 167/375  brain]
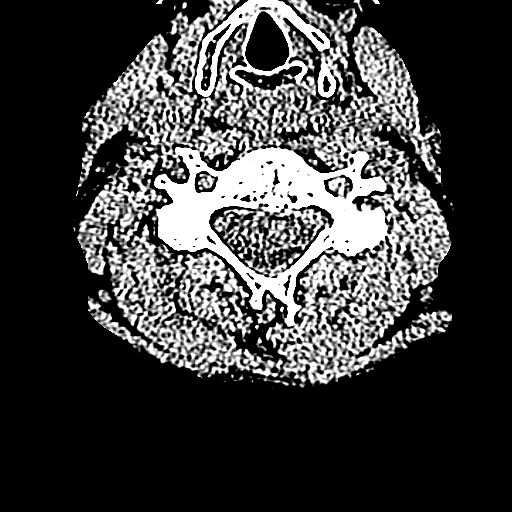
[im 208/375  brain]
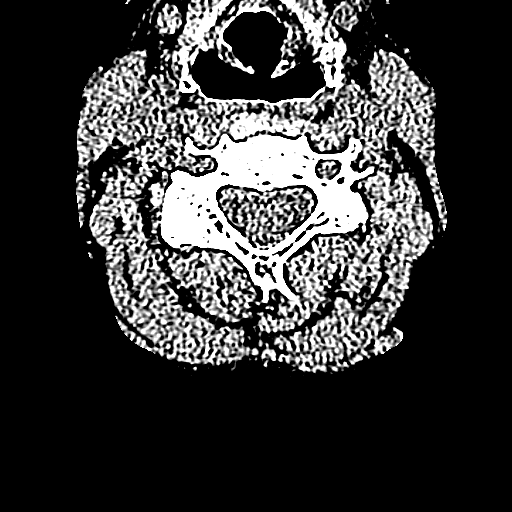
[im 250/375  brain]
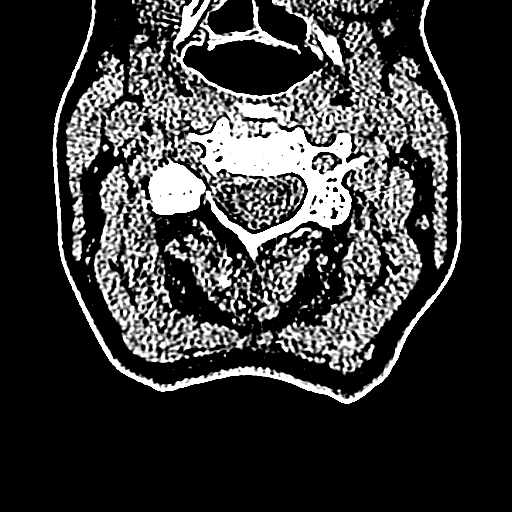
[im 291/375  brain]
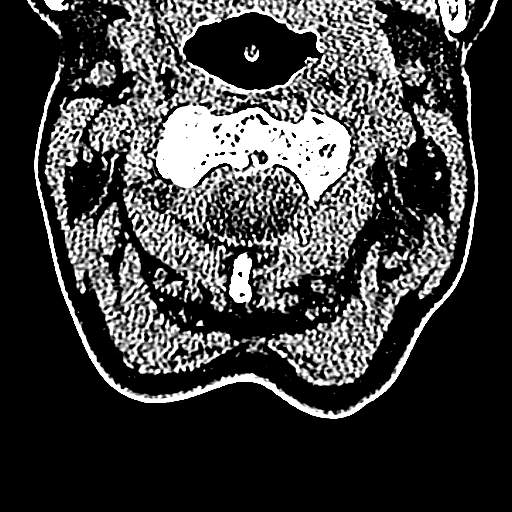
[im 333/375  brain]
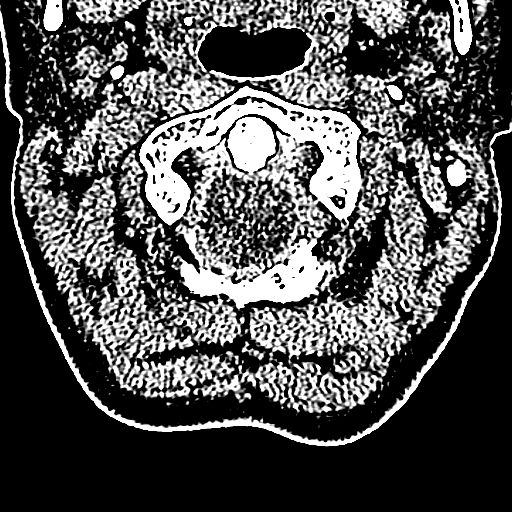

[17 of 47 positions shown; findings below may reference images not displayed]

FINDINGS: CT HEAD FINDINGS

No evidence of parenchymal hemorrhage or extra-axial fluid
collection. No mass lesion, mass effect, or midline shift.

No CT evidence of acute infarction.

Cerebral volume is within normal limits.  No ventriculomegaly.

The visualized paranasal sinuses are essentially clear. The mastoid
air cells are unopacified.

No evidence of calvarial fracture.

CT MAXILLOFACIAL FINDINGS

Mild soft tissue swelling overlying the right frontal bone (series
3/ image 54) and nasal bridge (series 3/image 59).

No evidence of maxillofacial fracture.

The bilateral orbits, including the globes and retroconal soft
tissues, are within normal limits.

The visualized paranasal sinuses are essentially clear. The mastoid
air cells are unopacified.

The mandible is intact. The bilateral mandibular condyles are
well-seated in the TMJs.

CT CERVICAL SPINE FINDINGS

Reversal of the normal cervical lordosis.

No evidence of fracture or dislocation. Vertebral body heights are
maintained. Dens appears intact.

No prevertebral soft tissue swelling.

Mild multilevel degenerative changes.

Visualized thyroid is unremarkable.

Visualized lung apices are clear.
IMPRESSION: Normal head CT.

Mild soft tissue swelling overlying the right frontal bone and nasal
bridge. No evidence of maxillofacial fracture.

No evidence of traumatic injury to the cervical spine. Mild
degenerative changes.

## 2017-10-10 ENCOUNTER — Emergency Department (HOSPITAL_COMMUNITY)
Admission: EM | Admit: 2017-10-10 | Discharge: 2017-10-11 | Disposition: A | Discharge status: Home / Self Care | Payer: Self-pay | Attending: Emergency Medicine | Admitting: Emergency Medicine

## 2017-10-10 ENCOUNTER — Encounter (HOSPITAL_COMMUNITY): Payer: Self-pay | Admitting: Emergency Medicine

## 2017-10-10 DIAGNOSIS — Y998 Other external cause status: Secondary | ICD-10-CM | POA: Insufficient documentation

## 2017-10-10 DIAGNOSIS — W5911XA Bitten by nonvenomous snake, initial encounter: Secondary | ICD-10-CM | POA: Insufficient documentation

## 2017-10-10 DIAGNOSIS — Y9389 Activity, other specified: Secondary | ICD-10-CM | POA: Insufficient documentation

## 2017-10-10 DIAGNOSIS — Y929 Unspecified place or not applicable: Secondary | ICD-10-CM | POA: Insufficient documentation

## 2017-10-10 DIAGNOSIS — Z79899 Other long term (current) drug therapy: Secondary | ICD-10-CM | POA: Insufficient documentation

## 2017-10-10 DIAGNOSIS — Z23 Encounter for immunization: Secondary | ICD-10-CM | POA: Insufficient documentation

## 2017-10-10 DIAGNOSIS — S91052A Open bite, left ankle, initial encounter: Secondary | ICD-10-CM | POA: Insufficient documentation

## 2017-10-10 LAB — CBC WITH DIFFERENTIAL/PLATELET
Basophils Absolute: 0 10*3/uL (ref 0.0–0.1)
Basophils Relative: 1 %
EOS ABS: 0 10*3/uL (ref 0.0–0.7)
EOS PCT: 1 %
HCT: 37.4 % (ref 36.0–46.0)
Hemoglobin: 12.1 g/dL (ref 12.0–15.0)
Lymphocytes Relative: 32 %
Lymphs Abs: 1.5 10*3/uL (ref 0.7–4.0)
MCH: 29.2 pg (ref 26.0–34.0)
MCHC: 32.4 g/dL (ref 30.0–36.0)
MCV: 90.1 fL (ref 78.0–100.0)
Monocytes Absolute: 0.5 10*3/uL (ref 0.1–1.0)
Monocytes Relative: 10 %
NEUTROS PCT: 56 %
Neutro Abs: 2.6 10*3/uL (ref 1.7–7.7)
PLATELETS: 265 10*3/uL (ref 150–400)
RBC: 4.15 MIL/uL (ref 3.87–5.11)
RDW: 12.5 % (ref 11.5–15.5)
WBC: 4.6 10*3/uL (ref 4.0–10.5)

## 2017-10-10 MED ORDER — HYDROMORPHONE HCL 1 MG/ML IJ SOLN
1.0000 mg | Freq: Once | INTRAMUSCULAR | Status: AC
  Administered 2017-10-10: 1 mg via INTRAVENOUS
  Filled 2017-10-10: qty 1

## 2017-10-10 MED ORDER — TETANUS-DIPHTH-ACELL PERTUSSIS 5-2.5-18.5 LF-MCG/0.5 IM SUSP
0.5000 mL | Freq: Once | INTRAMUSCULAR | Status: AC
  Administered 2017-10-10: 0.5 mL via INTRAMUSCULAR
  Filled 2017-10-10: qty 0.5

## 2017-10-10 NOTE — ED Triage Notes (Signed)
Patient here from home with complaints of snake bite. Reports that she was bit by a copperhead 1 hour ago to left ankle.

## 2017-10-10 NOTE — ED Provider Notes (Signed)
[facility]-EMERGENCY DEPT Provider Note   CSN: [id number] Arrival date & time: 10/10/17  2247     History   Chief Complaint Chief Complaint  Patient presents with  . Snake Bite    HPI Mia Bartlett is a 57 y.o. female.  Patient presents to the emergency department with a chief complaint of copperhead snake bite to her left ankle.  She states that she saw the snake, went inside to get something to kill it with, and when she came out she was bitten.  She was bitten approximately 1 hour prior to arrival.  She complains of moderate to severe pain.  She denies any intervention to this point.  She has never been bitten before.  She has never received antivenin.  Last tetanus shot is unknown.  She denies any numbness, weakness, or tingling.  The history is provided by the patient. No language interpreter was used.    Past Medical History:  Diagnosis Date  . Chronic fatigue   . Fibromyalgia   . Pancreatitis   . Trigeminal neuralgia     There are no active problems to display for this patient.   Past Surgical History:  Procedure Laterality Date  . ABDOMINAL HYSTERECTOMY    . CESAREAN SECTION       OB History   None      Home Medications    Prior to Admission medications   Medication Sig Start Date End Date Taking? Authorizing Provider  butalbital-acetaminophen-caffeine (FIORICET) 50-325-40 MG tablet Take 1-2 tablets by mouth every 6 (six) hours as needed for headache. 08/06/15   Beers, Charmayne Sheer, PA-C  cyclobenzaprine (FLEXERIL) 5 MG tablet Take 1 tablet (5 mg total) by mouth every 8 (eight) hours as needed for muscle spasms. 08/06/15   Beers, Charmayne Sheer, PA-C  methadone (DOLOPHINE) 10 MG/ML solution Take 200 mg by mouth daily.     [provider]    Family History Family History  Problem Relation Age of Onset  . Diabetes Mother   . Coronary artery disease Father   . Cancer Daughter   . Cancer Son     Social History Social History     Tobacco Use  . Smoking status: Never Smoker  Substance Use Topics  . Alcohol use: No  . Drug use: No     Allergies   Doxycycline   Review of Systems Review of Systems  All other systems reviewed and are negative.    Physical Exam Updated Vital Signs BP (!) 160/101 (BP Location: Right Arm)   Pulse (!) 112   Temp 98.9 F (37.2 C) (Oral)   SpO2 99%   Physical Exam  Constitutional: She is oriented to person, place, and time. She appears well-developed and well-nourished.  HENT:  Head: Normocephalic and atraumatic.  Eyes: Pupils are equal, round, and reactive to light. Conjunctivae and EOM are normal.  Neck: Normal range of motion. Neck supple.  Cardiovascular: Regular rhythm. Exam reveals no gallop and no friction rub.  No murmur heard. Mildly tachycardic  Pulmonary/Chest: Effort normal and breath sounds normal. No respiratory distress. She has no wheezes. She has no rales. She exhibits no tenderness.  Abdominal: Soft. Bowel sounds are normal. She exhibits no distension and no mass. There is no tenderness. There is no rebound and no guarding.  Musculoskeletal: Normal range of motion. She exhibits no edema or tenderness.  Neurological: She is alert and oriented to person, place, and time.  Skin: Skin is warm and dry.  Psychiatric:  She has a normal mood and affect. Her behavior is normal. Judgment and thought content normal.  Nursing note and vitals reviewed.    ED Treatments / Results  Labs (all labs ordered are listed, but only abnormal results are displayed) Labs Reviewed  BASIC METABOLIC PANEL  CBC WITH DIFFERENTIAL/PLATELET  CBC WITH DIFFERENTIAL/PLATELET  CBC WITH DIFFERENTIAL/PLATELET  PROTIME-INR  PROTIME-INR  PROTIME-INR  FIBRINOGEN  FIBRINOGEN  FIBRINOGEN    EKG None  Radiology No results found.  Procedures Procedures (including critical care time)  Medications Ordered in ED Medications  Tdap (BOOSTRIX) injection 0.5 mL (has no  administration in time range)  HYDROmorphone (DILAUDID) injection 1 mg (has no administration in time range)     Initial Impression / Assessment and Plan / ED Course  I have reviewed the triage vital signs and the nursing notes.  Pertinent labs & imaging results that were available during my care of the patient were reviewed by me and considered in my medical decision making (see chart for details).     Patient with snakebite to the left medial ankle.  There is minimal swelling, no erythema, initially looks like a dry bite/light envenomation.  Will treat pain.  Will monitor.  4:51 AM Patient reassessed multiple times.  There is been no progression of her snakebite.  There is very minimal swelling.  There is no erythema.  Her vital signs are stable.  Suspect dry bite.  Final Clinical Impressions(s) / ED Diagnoses   Final diagnoses:  Snake bite, initial encounter    ED Discharge Orders    None       Roxy HorsemanBrowning, Zorawar Strollo, PA-C 10/11/17 0507    Molpus, Jonny RuizJohn, MD 10/11/17 [phone]

## 2017-10-10 NOTE — ED Notes (Signed)
Pts foot elevated with pillows and blankets.

## 2017-10-11 LAB — CBC WITH DIFFERENTIAL/PLATELET
BASOS ABS: 0 10*3/uL (ref 0.0–0.1)
Basophils Relative: 0 %
EOS PCT: 1 %
Eosinophils Absolute: 0 10*3/uL (ref 0.0–0.7)
HCT: 36.3 % (ref 36.0–46.0)
Hemoglobin: 11.6 g/dL — ABNORMAL LOW (ref 12.0–15.0)
Lymphocytes Relative: 41 %
Lymphs Abs: 1.8 10*3/uL (ref 0.7–4.0)
MCH: 28.9 pg (ref 26.0–34.0)
MCHC: 32 g/dL (ref 30.0–36.0)
MCV: 90.5 fL (ref 78.0–100.0)
Monocytes Absolute: 0.3 10*3/uL (ref 0.1–1.0)
Monocytes Relative: 7 %
Neutro Abs: 2.2 10*3/uL (ref 1.7–7.7)
Neutrophils Relative %: 51 %
Platelets: 268 10*3/uL (ref 150–400)
RBC: 4.01 MIL/uL (ref 3.87–5.11)
RDW: 12.6 % (ref 11.5–15.5)
WBC: 4.4 10*3/uL (ref 4.0–10.5)

## 2017-10-11 LAB — BASIC METABOLIC PANEL
Anion gap: 8 (ref 5–15)
BUN: 16 mg/dL (ref 6–20)
CO2: 27 mmol/L (ref 22–32)
Calcium: 9.1 mg/dL (ref 8.9–10.3)
Chloride: 102 mmol/L (ref 101–111)
Creatinine, Ser: 1.14 mg/dL — ABNORMAL HIGH (ref 0.44–1.00)
GFR, EST NON AFRICAN AMERICAN: 53 mL/min — AB (ref >60)
Glucose, Bld: 103 mg/dL — ABNORMAL HIGH (ref 65–99)
POTASSIUM: 3.7 mmol/L (ref 3.5–5.1)
Sodium: 137 mmol/L (ref 135–145)

## 2017-10-11 LAB — FIBRINOGEN
FIBRINOGEN: 283 mg/dL (ref 210–475)
Fibrinogen: 328 mg/dL (ref 210–475)

## 2017-10-11 LAB — PROTIME-INR
INR: 1.1
INR: 1.1
Prothrombin Time: 14.1 seconds (ref 11.4–15.2)
Prothrombin Time: 14.1 seconds (ref 11.4–15.2)

## 2017-10-11 MED ORDER — HYDROMORPHONE HCL 1 MG/ML IJ SOLN
1.0000 mg | Freq: Once | INTRAMUSCULAR | Status: AC
  Administered 2017-10-11: 1 mg via INTRAVENOUS
  Filled 2017-10-11: qty 1

## 2017-10-11 NOTE — ED Notes (Signed)
Will not need to rerun labs per Dr. Read DriversMolpus

## 2017-10-11 NOTE — ED Notes (Signed)
Spoke to poison control they recommend continuing to monitor her and keeping extremity elevated, avoiding ice

## 2017-10-11 NOTE — ED Notes (Signed)
Foot 22 cm Ankle 21 cm Calf 32 cm Thigh 40cm Denies groin pain  Measurements stayed the same from arrival

## 2019-02-21 ENCOUNTER — Other Ambulatory Visit: Payer: Self-pay

## 2019-02-21 DIAGNOSIS — Z20822 Contact with and (suspected) exposure to covid-19: Secondary | ICD-10-CM

## 2019-02-22 LAB — NOVEL CORONAVIRUS, NAA: SARS-CoV-2, NAA: NOT DETECTED

## 2022-05-21 DIAGNOSIS — Z419 Encounter for procedure for purposes other than remedying health state, unspecified: Secondary | ICD-10-CM | POA: Diagnosis not present

## 2022-06-19 DIAGNOSIS — Z419 Encounter for procedure for purposes other than remedying health state, unspecified: Secondary | ICD-10-CM | POA: Diagnosis not present

## 2023-12-25 ENCOUNTER — Emergency Department (HOSPITAL_COMMUNITY): Payer: MEDICAID

## 2023-12-25 ENCOUNTER — Encounter (HOSPITAL_COMMUNITY): Payer: Self-pay | Admitting: *Deleted

## 2023-12-25 ENCOUNTER — Inpatient Hospital Stay (HOSPITAL_COMMUNITY)
Admission: EM | Admit: 2023-12-25 | Discharge: 2024-01-01 | DRG: 439 | Disposition: A | Discharge status: Home / Self Care | Payer: MEDICAID | Attending: Family Medicine | Admitting: Family Medicine

## 2023-12-25 ENCOUNTER — Other Ambulatory Visit: Payer: Self-pay

## 2023-12-25 DIAGNOSIS — K861 Other chronic pancreatitis: Secondary | ICD-10-CM | POA: Diagnosis present

## 2023-12-25 DIAGNOSIS — L299 Pruritus, unspecified: Secondary | ICD-10-CM | POA: Diagnosis present

## 2023-12-25 DIAGNOSIS — Z90710 Acquired absence of both cervix and uterus: Secondary | ICD-10-CM | POA: Diagnosis not present

## 2023-12-25 DIAGNOSIS — Z634 Disappearance and death of family member: Secondary | ICD-10-CM

## 2023-12-25 DIAGNOSIS — R1110 Vomiting, unspecified: Secondary | ICD-10-CM | POA: Diagnosis present

## 2023-12-25 DIAGNOSIS — Z59819 Housing instability, housed unspecified: Secondary | ICD-10-CM

## 2023-12-25 DIAGNOSIS — Z8249 Family history of ischemic heart disease and other diseases of the circulatory system: Secondary | ICD-10-CM

## 2023-12-25 DIAGNOSIS — K8020 Calculus of gallbladder without cholecystitis without obstruction: Secondary | ICD-10-CM | POA: Diagnosis present

## 2023-12-25 DIAGNOSIS — G4700 Insomnia, unspecified: Secondary | ICD-10-CM | POA: Diagnosis present

## 2023-12-25 DIAGNOSIS — K851 Biliary acute pancreatitis without necrosis or infection: Secondary | ICD-10-CM | POA: Diagnosis present

## 2023-12-25 DIAGNOSIS — B029 Zoster without complications: Secondary | ICD-10-CM | POA: Diagnosis not present

## 2023-12-25 DIAGNOSIS — K859 Acute pancreatitis without necrosis or infection, unspecified: Secondary | ICD-10-CM | POA: Diagnosis not present

## 2023-12-25 DIAGNOSIS — N179 Acute kidney failure, unspecified: Secondary | ICD-10-CM | POA: Diagnosis present

## 2023-12-25 DIAGNOSIS — G8929 Other chronic pain: Secondary | ICD-10-CM | POA: Diagnosis not present

## 2023-12-25 DIAGNOSIS — G894 Chronic pain syndrome: Secondary | ICD-10-CM | POA: Diagnosis present

## 2023-12-25 DIAGNOSIS — D649 Anemia, unspecified: Secondary | ICD-10-CM | POA: Diagnosis present

## 2023-12-25 DIAGNOSIS — Z1152 Encounter for screening for COVID-19: Secondary | ICD-10-CM | POA: Diagnosis not present

## 2023-12-25 DIAGNOSIS — F1120 Opioid dependence, uncomplicated: Secondary | ICD-10-CM | POA: Diagnosis present

## 2023-12-25 DIAGNOSIS — Z23 Encounter for immunization: Secondary | ICD-10-CM

## 2023-12-25 DIAGNOSIS — B0229 Other postherpetic nervous system involvement: Secondary | ICD-10-CM | POA: Diagnosis present

## 2023-12-25 DIAGNOSIS — Z833 Family history of diabetes mellitus: Secondary | ICD-10-CM

## 2023-12-25 DIAGNOSIS — R5382 Chronic fatigue, unspecified: Secondary | ICD-10-CM | POA: Diagnosis present

## 2023-12-25 DIAGNOSIS — M797 Fibromyalgia: Secondary | ICD-10-CM | POA: Diagnosis present

## 2023-12-25 DIAGNOSIS — Z881 Allergy status to other antibiotic agents status: Secondary | ICD-10-CM | POA: Diagnosis not present

## 2023-12-25 DIAGNOSIS — F4321 Adjustment disorder with depressed mood: Secondary | ICD-10-CM | POA: Diagnosis present

## 2023-12-25 DIAGNOSIS — K5909 Other constipation: Secondary | ICD-10-CM | POA: Diagnosis present

## 2023-12-25 DIAGNOSIS — K219 Gastro-esophageal reflux disease without esophagitis: Secondary | ICD-10-CM | POA: Diagnosis present

## 2023-12-25 DIAGNOSIS — R748 Abnormal levels of other serum enzymes: Secondary | ICD-10-CM | POA: Diagnosis present

## 2023-12-25 LAB — URINALYSIS, ROUTINE W REFLEX MICROSCOPIC
Bacteria, UA: NONE SEEN
Bilirubin Urine: NEGATIVE
Glucose, UA: 50 mg/dL — AB
Hgb urine dipstick: NEGATIVE
Ketones, ur: NEGATIVE mg/dL
Nitrite: NEGATIVE
Protein, ur: 100 mg/dL — AB
Specific Gravity, Urine: 1.021 (ref 1.005–1.030)
pH: 5 (ref 5.0–8.0)

## 2023-12-25 LAB — COMPREHENSIVE METABOLIC PANEL WITH GFR
ALT: 69 U/L — ABNORMAL HIGH (ref 0–44)
AST: 141 U/L — ABNORMAL HIGH (ref 15–41)
Albumin: 3.3 g/dL — ABNORMAL LOW (ref 3.5–5.0)
Alkaline Phosphatase: 328 U/L — ABNORMAL HIGH (ref 38–126)
Anion gap: 12 (ref 5–15)
BUN: 19 mg/dL (ref 8–23)
CO2: 27 mmol/L (ref 22–32)
Calcium: 9.1 mg/dL (ref 8.9–10.3)
Chloride: 95 mmol/L — ABNORMAL LOW (ref 98–111)
Creatinine, Ser: 1.08 mg/dL — ABNORMAL HIGH (ref 0.44–1.00)
GFR, Estimated: 58 mL/min — ABNORMAL LOW (ref >60)
Glucose, Bld: 125 mg/dL — ABNORMAL HIGH (ref 70–99)
Potassium: 4.8 mmol/L (ref 3.5–5.1)
Sodium: 134 mmol/L — ABNORMAL LOW (ref 135–145)
Total Bilirubin: 0.6 mg/dL (ref 0.0–1.2)
Total Protein: 6.6 g/dL (ref 6.5–8.1)

## 2023-12-25 LAB — I-STAT CHEM 8, ED
BUN: 19 mg/dL (ref 8–23)
Calcium, Ion: 1.15 mmol/L (ref 1.15–1.40)
Chloride: 97 mmol/L — ABNORMAL LOW (ref 98–111)
Creatinine, Ser: 1.2 mg/dL — ABNORMAL HIGH (ref 0.44–1.00)
Glucose, Bld: 125 mg/dL — ABNORMAL HIGH (ref 70–99)
HCT: 33 % — ABNORMAL LOW (ref 36.0–46.0)
Hemoglobin: 11.2 g/dL — ABNORMAL LOW (ref 12.0–15.0)
Potassium: 4 mmol/L (ref 3.5–5.1)
Sodium: 137 mmol/L (ref 135–145)
TCO2: 30 mmol/L (ref 22–32)

## 2023-12-25 LAB — CBC
HCT: 33.3 % — ABNORMAL LOW (ref 36.0–46.0)
Hemoglobin: 10.7 g/dL — ABNORMAL LOW (ref 12.0–15.0)
MCH: 29.6 pg (ref 26.0–34.0)
MCHC: 32.1 g/dL (ref 30.0–36.0)
MCV: 92 fL (ref 80.0–100.0)
Platelets: 362 K/uL (ref 150–400)
RBC: 3.62 MIL/uL — ABNORMAL LOW (ref 3.87–5.11)
RDW: 12.8 % (ref 11.5–15.5)
WBC: 10.4 K/uL (ref 4.0–10.5)
nRBC: 0 % (ref 0.0–0.2)

## 2023-12-25 LAB — LIPASE, BLOOD: Lipase: 511 U/L — ABNORMAL HIGH (ref 11–51)

## 2023-12-25 LAB — TROPONIN I (HIGH SENSITIVITY): Troponin I (High Sensitivity): 13 ng/L (ref <18)

## 2023-12-25 MED ORDER — METHADONE HCL 10 MG/ML PO CONC: 200.0000 mg | Freq: Every day | ORAL | Status: DC

## 2023-12-25 MED ORDER — ONDANSETRON HCL 4 MG/2ML IJ SOLN
4.0000 mg | Freq: Once | INTRAMUSCULAR | Status: AC
  Administered 2023-12-25: 4 mg via INTRAVENOUS
  Filled 2023-12-25: qty 2

## 2023-12-25 MED ORDER — HYDROMORPHONE HCL 1 MG/ML IJ SOLN
1.0000 mg | Freq: Once | INTRAMUSCULAR | Status: AC
  Administered 2023-12-25: 1 mg via INTRAVENOUS
  Filled 2023-12-25: qty 1

## 2023-12-25 MED ORDER — IOHEXOL 350 MG/ML SOLN
75.0000 mL | Freq: Once | INTRAVENOUS | Status: AC | PRN
  Administered 2023-12-25: 75 mL via INTRAVENOUS

## 2023-12-25 MED ORDER — LACTATED RINGERS IV SOLN: INTRAVENOUS | Status: AC

## 2023-12-25 MED ORDER — ONDANSETRON HCL 4 MG PO TABS
4.0000 mg | ORAL_TABLET | Freq: Four times a day (QID) | ORAL | Status: DC | PRN
  Administered 2023-12-29 – 2023-12-30 (×3): 4 mg via ORAL
  Filled 2023-12-25 (×3): qty 1

## 2023-12-25 MED ORDER — HYDROMORPHONE HCL 1 MG/ML IJ SOLN
1.0000 mg | INTRAMUSCULAR | Status: DC | PRN
  Administered 2023-12-26 – 2024-01-01 (×24): 1 mg via INTRAVENOUS
  Filled 2023-12-25 (×25): qty 1

## 2023-12-25 MED ORDER — LACTATED RINGERS IV BOLUS
1000.0000 mL | Freq: Once | INTRAVENOUS | Status: AC
  Administered 2023-12-25: 1000 mL via INTRAVENOUS

## 2023-12-25 MED ORDER — ONDANSETRON HCL 4 MG/2ML IJ SOLN
4.0000 mg | Freq: Four times a day (QID) | INTRAMUSCULAR | Status: DC | PRN
  Administered 2023-12-26 – 2023-12-31 (×6): 4 mg via INTRAVENOUS
  Filled 2023-12-25 (×6): qty 2

## 2023-12-25 MED ORDER — ACETAMINOPHEN 650 MG RE SUPP: 650.0000 mg | Freq: Four times a day (QID) | RECTAL | Status: DC | PRN

## 2023-12-25 MED ORDER — ACETAMINOPHEN 325 MG PO TABS
650.0000 mg | ORAL_TABLET | Freq: Four times a day (QID) | ORAL | Status: DC | PRN
  Administered 2023-12-26: 650 mg via ORAL
  Filled 2023-12-25: qty 2

## 2023-12-25 NOTE — ED Notes (Signed)
 The pt goes to the methadone  clinic

## 2023-12-25 NOTE — H&P (Signed)
 History and Physical    Patient: Mia Bartlett FMW:[id number] DOB: 1961/01/18 DOA: 12/25/2023 DOS: the patient was seen and examined on 12/25/2023 PCP: Patient, No Pcp Per  Patient coming from: Home  Chief Complaint:  Chief Complaint  Patient presents with   Abdominal Pain   HPI: Mia Bartlett is a 63 y.o. female on chronic methadone  maintenance w h/o pancreatitis once before who presents with RUQ abdominal pain which started 4 days ago.  She has been in the hospital frequently visiting her husband who is dying.  The patient has a history of pancreatitis of unknown etiology approximately 10 years ago.  When the pain started she noted that it felt like the pancreatitis she had in the past.  She put off seeking evaluation until today when the pain became unbearable.  She denies any diarrhea.  She has had 2 episodes of vomiting today.  She is also having a lot of trouble with heartburn and severe acid reflux.  She denies any fevers or chills. She actually came down to the emergency department from her husband's hospital room. In the emergency department the patient had a CT scan of her abdomen pelvis which was consistent with pancreatitis and there was a gallstone present in the ampulla of Vater.  Her lipase was greater than 500 her AST 141 and ALT 69.  Her t. Bili was wnl at 0.6.   She was treated with IV fluids and IV Dilaudid  which did help her pain greatly.  [facility] was called by the emergency department physician.  The patient will be admitted to the hospitalist service for further management.  Review of Systems: As mentioned in the history of present illness. All other systems reviewed and are negative. Past Medical History:  Diagnosis Date   Chronic fatigue    Fibromyalgia    Pancreatitis    Trigeminal neuralgia    Past Surgical History:  Procedure Laterality Date   ABDOMINAL HYSTERECTOMY     CESAREAN SECTION     Social History:  reports that she has never smoked. She does  not have any smokeless tobacco history on file. She reports that she does not drink alcohol and does not use drugs.  Allergies  Allergen Reactions   Doxycycline Rash    Family History  Problem Relation Age of Onset   Diabetes Mother    Coronary artery disease Father    Cancer Daughter    Cancer Son     Prior to Admission medications   Medication Sig Start Date End Date Taking? Authorizing Provider  methadone  (DOLOPHINE ) 10 MG/ML solution Take 200 mg by mouth daily.     [provider]    Physical Exam: Vitals:   12/25/23 2052 12/25/23 2102  BP: (!) 154/97   Pulse: (!) 105   Resp: 19   Temp: 98.3 F (36.8 C)   SpO2: 98%   Weight:  61.2 kg  Height:  5' 4 (1.626 m)   Physical Exam:  General: No acute distress, pale HEENT: Normocephalic, atraumatic, PERRL, no teeth Cardiovascular: Normal rate and rhythm. Distal pulses intact. Pulmonary: Normal pulmonary effort, normal breath sounds Gastrointestinal: Nondistended abdomen, soft, RUQ and mid epigastric tenderness, normoactive bowel sounds Musculoskeletal:Normal ROM, no lower ext edema Lymphadenopathy: No cervical LAD. Skin: Skin is warm and dry. Neuro: No focal deficits noted, AAOx3. PSYCH: Attentive and cooperative  Data Reviewed:  Results for orders placed or performed during the hospital encounter of 12/25/23 (from the past 24 hours)  Urinalysis, Routine w reflex microscopic -  Urine, Clean Catch     Status: Abnormal   Collection Time: 12/25/23  9:05 PM  Result Value Ref Range   Color, Urine YELLOW YELLOW   APPearance CLEAR CLEAR   Specific Gravity, Urine 1.021 1.005 - 1.030   pH 5.0 5.0 - 8.0   Glucose, UA 50 (A) NEGATIVE mg/dL   Hgb urine dipstick NEGATIVE NEGATIVE   Bilirubin Urine NEGATIVE NEGATIVE   Ketones, ur NEGATIVE NEGATIVE mg/dL   Protein, ur 899 (A) NEGATIVE mg/dL   Nitrite NEGATIVE NEGATIVE   Leukocytes,Ua TRACE (A) NEGATIVE   RBC / HPF 0-5 0 - 5 RBC/hpf   WBC, UA 0-5 0 - 5 WBC/hpf    Bacteria, UA NONE SEEN NONE SEEN   Squamous Epithelial / HPF 0-5 0 - 5 /HPF   Mucus PRESENT    Hyaline Casts, UA PRESENT    Uric Acid Crys, UA PRESENT   I-stat chem 8, ED (not at [facility], [facility] or [facility])     Status: Abnormal   Collection Time: 12/25/23  9:18 PM  Result Value Ref Range   Sodium 137 135 - 145 mmol/L   Potassium 4.0 3.5 - 5.1 mmol/L   Chloride 97 (L) 98 - 111 mmol/L   BUN 19 8 - 23 mg/dL   Creatinine, Ser 8.79 (H) 0.44 - 1.00 mg/dL   Glucose, Bld 874 (H) 70 - 99 mg/dL   Calcium, Ion 8.84 8.84 - 1.40 mmol/L   TCO2 30 22 - 32 mmol/L   Hemoglobin 11.2 (L) 12.0 - 15.0 g/dL   HCT 66.9 (L) 63.9 - 53.9 %  Lipase, blood     Status: Abnormal   Collection Time: 12/25/23  9:20 PM  Result Value Ref Range   Lipase 511 (H) 11 - 51 U/L  Comprehensive metabolic panel     Status: Abnormal   Collection Time: 12/25/23  9:20 PM  Result Value Ref Range   Sodium 134 (L) 135 - 145 mmol/L   Potassium 4.8 3.5 - 5.1 mmol/L   Chloride 95 (L) 98 - 111 mmol/L   CO2 27 22 - 32 mmol/L   Glucose, Bld 125 (H) 70 - 99 mg/dL   BUN 19 8 - 23 mg/dL   Creatinine, Ser 8.91 (H) 0.44 - 1.00 mg/dL   Calcium 9.1 8.9 - 89.6 mg/dL   Total Protein 6.6 6.5 - 8.1 g/dL   Albumin 3.3 (L) 3.5 - 5.0 g/dL   AST 858 (H) 15 - 41 U/L   ALT 69 (H) 0 - 44 U/L   Alkaline Phosphatase 328 (H) 38 - 126 U/L   Total Bilirubin 0.6 0.0 - 1.2 mg/dL   GFR, Estimated 58 (L) >60 mL/min   Anion gap 12 5 - 15  CBC     Status: Abnormal   Collection Time: 12/25/23  9:20 PM  Result Value Ref Range   WBC 10.4 4.0 - 10.5 K/uL   RBC 3.62 (L) 3.87 - 5.11 MIL/uL   Hemoglobin 10.7 (L) 12.0 - 15.0 g/dL   HCT 66.6 (L) 63.9 - 53.9 %   MCV 92.0 80.0 - 100.0 fL   MCH 29.6 26.0 - 34.0 pg   MCHC 32.1 30.0 - 36.0 g/dL   RDW 87.1 88.4 - 84.4 %   Platelets 362 150 - 400 K/uL   nRBC 0.0 0.0 - 0.2 %  Troponin I (High Sensitivity)     Status: None   Collection Time: 12/25/23  9:34 PM  Result Value Ref Range   Troponin I (  High Sensitivity) 13 <18  ng/L   CT abdomen, pelvis IMPRESSION: 1. Findings suggest acute on chronic pancreatitis secondary to an 8 mm gallstone at the ampulla of vater. The main pancreatic duct measures 10 mm in diameter. No organized fluid collection. 2. Moderate colonic stool burden greatest in the right colon.    Assessment and Plan: Acute gallstone pancreatitis  - NPO, IV fluids, pain control - [facility] has been consulted  2. Grief - the patient's husband is currently hospitalized on the 6th floor and is actively dying per her report.  3.  Social -the patient says she is in the process of being evicted.  Her husband is actively dying and she has no close contacts.  No support. - Social work consult needed  4.  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 - Continue  5. Constipation -the patient denies constipation but she also reports that she has a bowel movement about once a week.  CT also reveals a moderate stool burden.  She should probably be set up with something daily like MiraLAX  or senna given her methadone  use once her pancreatitis is cleared.    Advance Care Planning:   Code Status: Not on file the patient names her friend Mia Bartlett as her surrogate decision maker and she wants to be full code.  Consults: [facility]  Family Communication: none  Severity of Illness: The appropriate patient status for this patient is INPATIENT. Inpatient status is judged to be reasonable and necessary in order to provide the required intensity of service to ensure the patient's safety. The patient's presenting symptoms, physical exam findings, and initial radiographic and laboratory data in the context of their chronic comorbidities is felt to place them at high risk for further clinical deterioration. Furthermore, it is not anticipated that the patient will be medically stable for discharge from the hospital within 2 midnights of admission.   * I certify that at the  point of admission it is my clinical judgment that the patient will require inpatient hospital care spanning beyond 2 midnights from the point of admission due to high intensity of service, high risk for further deterioration and high frequency of surveillance required.*  Author: ARTHEA CHILD, MD 12/25/2023 11:18 PM  For on call review [email or web address].

## 2023-12-25 NOTE — ED Provider Notes (Signed)
 [place] EMERGENCY DEPARTMENT AT [facility] Provider Note   CSN: [id number] Arrival date & time: 12/25/23  2048     Patient presents with: Abdominal Pain   Mia Bartlett is a 63 y.o. female.   HPI 63 year old female presents with chest and abdominal pain.  She is concerned about recurrent pancreatitis.  She thinks her last episode was about 5 years ago.  The cause was never determined.  No significant alcohol use.  Patient states that starting about 5 days ago she has been having some on and off abdominal pain that has not been that bad.  However starting last night it has become much more severe and she has had some vomiting.  Pain is primarily in her right side and radiates into her chest as well.  No fevers, shortness of breath, diarrhea.  No hematemesis.  Prior to Admission medications   Medication Sig Start Date End Date Taking? Authorizing Provider  methadone  (DOLOPHINE ) 10 MG/ML solution Take 200 mg by mouth daily.     [provider]    Allergies: Doxycycline    Review of Systems  Constitutional:  Negative for fever.  Respiratory:  Negative for shortness of breath.   Cardiovascular:  Positive for chest pain.  Gastrointestinal:  Positive for abdominal pain and vomiting. Negative for diarrhea.  Genitourinary:  Negative for dysuria.    Updated Vital Signs BP (!) 154/97 (BP Location: Right Arm)   Pulse (!) 105   Temp 98.3 F (36.8 C)   Resp 19   Ht 5' 4 (1.626 m)   Wt 61.2 kg   SpO2 98%   BMI 23.16 kg/m   Physical Exam Vitals and nursing note reviewed.  Constitutional:      Appearance: She is well-developed. She is not diaphoretic.     Comments: Crying, in pain  HENT:     Head: Normocephalic and atraumatic.  Cardiovascular:     Rate and Rhythm: Regular rhythm. Tachycardia present.     Heart sounds: Normal heart sounds.  Pulmonary:     Effort: Pulmonary effort is normal.     Breath sounds: Normal breath sounds.  Abdominal:      Palpations: Abdomen is soft.     Tenderness: There is abdominal tenderness in the right upper quadrant and right lower quadrant.  Skin:    General: Skin is warm and dry.  Neurological:     Mental Status: She is alert.     (all labs ordered are listed, but only abnormal results are displayed) Labs Reviewed  LIPASE, BLOOD - Abnormal; Notable for the following components:      Result Value   Lipase 511 (*)    All other components within normal limits  COMPREHENSIVE METABOLIC PANEL WITH GFR - Abnormal; Notable for the following components:   Sodium 134 (*)    Chloride 95 (*)    Glucose, Bld 125 (*)    Creatinine, Ser 1.08 (*)    Albumin 3.3 (*)    AST 141 (*)    ALT 69 (*)    Alkaline Phosphatase 328 (*)    GFR, Estimated 58 (*)    All other components within normal limits  CBC - Abnormal; Notable for the following components:   RBC 3.62 (*)    Hemoglobin 10.7 (*)    HCT 33.3 (*)    All other components within normal limits  URINALYSIS, ROUTINE W REFLEX MICROSCOPIC - Abnormal; Notable for the following components:   Glucose, UA 50 (*)  Protein, ur 100 (*)    Leukocytes,Ua TRACE (*)    All other components within normal limits  I-STAT CHEM 8, ED - Abnormal; Notable for the following components:   Chloride 97 (*)    Creatinine, Ser 1.20 (*)    Glucose, Bld 125 (*)    Hemoglobin 11.2 (*)    HCT 33.0 (*)    All other components within normal limits  TROPONIN I (HIGH SENSITIVITY)  TROPONIN I (HIGH SENSITIVITY)    EKG: EKG Interpretation Date/Time:  Saturday December 25 2023 21:36:04 EDT Ventricular Rate:  94 PR Interval:  125 QRS Duration:  87 QT Interval:  359 QTC Calculation: 449 R Axis:   80  Text Interpretation: Sinus rhythm Left ventricular hypertrophy Baseline wander in lead(s) V5 V6 no significant change since 2017 Confirmed by Freddi Hamilton [phone]) on 12/25/2023 10:17:03 PM  Radiology: CT ABDOMEN PELVIS W CONTRAST Result Date: 12/25/2023 EXAM: CT ABDOMEN  AND PELVIS WITH CONTRAST 12/25/2023 10:14:49 PM TECHNIQUE: CT of the abdomen and pelvis was performed with the administration of intravenous contrast. Multiplanar reformatted images are provided for review. Automated exposure control, iterative reconstruction, and/or weight-based adjustment of the mA/kV was utilized to reduce the radiation dose to as low as reasonably achievable. COMPARISON: 12/17/2005 CLINICAL HISTORY: Cholelithiasis; RLQ abdominal pain. FINDINGS: LOWER CHEST: No acute abnormality. LIVER: Mild intrahepatic biliary dilation. GALLBLADDER AND BILE DUCTS: There is an 8 mm gallstone at the ampulla of vater. No extrahepatic biliary dilation. The common bile duct measures 7 mm. Mild gallbladder distention. SPLEEN: No acute abnormality. PANCREAS: Atrophic pancreas with parenchymal calcification compatible with sequelae of chronic pancreatitis. Dilated main pancreatic duct measuring 10 mm. Mild fluid with stranding about the head of the pancreas . ADRENAL GLANDS: No acute abnormality. KIDNEYS, URETERS AND BLADDER: No stones in the kidneys or ureters. No hydronephrosis. No perinephric or periureteral stranding. Urinary bladder is unremarkable. GI AND BOWEL: Moderate colonic stool burden greatest in the right colon. No bowel wall thickening. No evidence of obstruction. Normal appendix. There is mild wall thickening of the descending duodenum. The findings may be related to duodenitis or, given the gallstone at the ampulla of vater, gallstone pancreatitis. PERITONEUM AND RETROPERITONEUM: Trace fluid and stranding in the right upper quadrant adjacent to the descending portion of the duodenum. No Free intraperitoneal air. VASCULATURE: Aorta is normal in caliber. LYMPH NODES: No lymphadenopathy. REPRODUCTIVE ORGANS: Hysterectomy. BONES AND SOFT TISSUES: No acute osseous abnormality. Fat containing periumbilical hernia. IMPRESSION: 1. Findings suggest acute on chronic pancreatitis secondary to an 8 mm gallstone at  the ampulla of vater. The main pancreatic duct measures 10 mm in diameter. No organized fluid collection. 2. Moderate colonic stool burden greatest in the right colon. Electronically signed by: Norman Gatlin MD 12/25/2023 10:29 PM EDT RP Workstation: [id number]   DG Chest Portable 1 View Result Date: 12/25/2023 CLINICAL DATA:  Right upper quadrant pain, chest pain EXAM: PORTABLE CHEST 1 VIEW COMPARISON:  09/13/2004 FINDINGS: Single frontal view of the chest emanates an unremarkable cardiac silhouette. No airspace disease, effusion, or pneumothorax. No acute bony abnormalities. IMPRESSION: 1. No acute intrathoracic process. Electronically Signed   By: Ozell Daring M.D.   On: 12/25/2023 21:57     Procedures   Medications Ordered in the ED  ondansetron  (ZOFRAN ) injection 4 mg (has no administration in time range)  HYDROmorphone  (DILAUDID ) injection 1 mg (1 mg Intravenous Given 12/25/23 2147)  lactated ringers  bolus 1,000 mL (0 mLs Intravenous Stopped 12/25/23 2257)  iohexol  (OMNIPAQUE ) 350 MG/ML injection  75 mL (75 mLs Intravenous Contrast Given 12/25/23 2215)  HYDROmorphone  (DILAUDID ) injection 1 mg (1 mg Intravenous Given 12/25/23 2303)                                    Medical Decision Making Amount and/or Complexity of Data Reviewed Labs:     Details: Mild LFT abnormalities Radiology: ordered and independent interpretation performed.    Details: CBD stone ECG/medicine tests: ordered and independent interpretation performed.    Details: Sinus tachycardia  Risk Prescription drug management. Decision regarding hospitalization.   Patient is found to have pancreatitis.  Lipase is currently pending based on it having to be read diluted by laboratory.  Her CMP does have some mild LFT abnormalities though her bili is normal.  CT does show a CBD stone.  I talked to Dr. Suzann of [facility] gastroenterology, they will see in consultation, asked for patient to be n.p.o. after midnight.  Otherwise  treat with supportive care such as for pancreatitis.  No fever or ill appearance to suggest cholangitis.  Discussed with Dr. Arthea for admission.     Final diagnoses:  Acute biliary pancreatitis without infection or necrosis    ED Discharge Orders     None          Freddi Hamilton, MD 12/25/23 2320

## 2023-12-25 NOTE — ED Triage Notes (Addendum)
 Reports severe abdominal pain. States her pancreas may be acting up. Patient arrive using a walker to ambulate. Patient was here visiting her husband. Reports vomiting 1 time. Pain is in the RUQ.

## 2023-12-26 DIAGNOSIS — K851 Biliary acute pancreatitis without necrosis or infection: Secondary | ICD-10-CM | POA: Diagnosis not present

## 2023-12-26 LAB — CBC
HCT: 30.6 % — ABNORMAL LOW (ref 36.0–46.0)
Hemoglobin: 10.2 g/dL — ABNORMAL LOW (ref 12.0–15.0)
MCH: 29.7 pg (ref 26.0–34.0)
MCHC: 33.3 g/dL (ref 30.0–36.0)
MCV: 89.2 fL (ref 80.0–100.0)
Platelets: 296 K/uL (ref 150–400)
RBC: 3.43 MIL/uL — ABNORMAL LOW (ref 3.87–5.11)
RDW: 12.8 % (ref 11.5–15.5)
WBC: 8.9 K/uL (ref 4.0–10.5)
nRBC: 0 % (ref 0.0–0.2)

## 2023-12-26 LAB — IRON AND TIBC
Iron: 31 ug/dL (ref 28–170)
Saturation Ratios: 12 % (ref 10.4–31.8)
TIBC: 255 ug/dL (ref 250–450)
UIBC: 224 ug/dL

## 2023-12-26 LAB — HIV ANTIBODY (ROUTINE TESTING W REFLEX): HIV Screen 4th Generation wRfx: NONREACTIVE

## 2023-12-26 LAB — COMPREHENSIVE METABOLIC PANEL WITH GFR
ALT: 82 U/L — ABNORMAL HIGH (ref 0–44)
AST: 125 U/L — ABNORMAL HIGH (ref 15–41)
Albumin: 2.9 g/dL — ABNORMAL LOW (ref 3.5–5.0)
Alkaline Phosphatase: 315 U/L — ABNORMAL HIGH (ref 38–126)
Anion gap: 10 (ref 5–15)
BUN: 12 mg/dL (ref 8–23)
CO2: 30 mmol/L (ref 22–32)
Calcium: 8.8 mg/dL — ABNORMAL LOW (ref 8.9–10.3)
Chloride: 98 mmol/L (ref 98–111)
Creatinine, Ser: 0.87 mg/dL (ref 0.44–1.00)
GFR, Estimated: >60 mL/min (ref >60)
Glucose, Bld: 105 mg/dL — ABNORMAL HIGH (ref 70–99)
Potassium: 4.2 mmol/L (ref 3.5–5.1)
Sodium: 138 mmol/L (ref 135–145)
Total Bilirubin: 0.6 mg/dL (ref 0.0–1.2)
Total Protein: 6.2 g/dL — ABNORMAL LOW (ref 6.5–8.1)

## 2023-12-26 LAB — MAGNESIUM: Magnesium: 1.3 mg/dL — ABNORMAL LOW (ref 1.7–2.4)

## 2023-12-26 LAB — FERRITIN: Ferritin: 94 ng/mL (ref 11–307)

## 2023-12-26 LAB — TROPONIN I (HIGH SENSITIVITY): Troponin I (High Sensitivity): 8 ng/L (ref <18)

## 2023-12-26 MED ORDER — HYDROMORPHONE HCL 1 MG/ML IJ SOLN
0.5000 mg | Freq: Once | INTRAMUSCULAR | Status: AC
  Administered 2023-12-26: 0.5 mg via INTRAVENOUS
  Filled 2023-12-26: qty 0.5

## 2023-12-26 MED ORDER — ZOLPIDEM TARTRATE 5 MG PO TABS
5.0000 mg | ORAL_TABLET | Freq: Every evening | ORAL | Status: DC | PRN
  Administered 2023-12-26 – 2023-12-31 (×6): 5 mg via ORAL
  Filled 2023-12-26 (×6): qty 1

## 2023-12-26 MED ORDER — ZOLPIDEM TARTRATE 5 MG PO TABS
5.0000 mg | ORAL_TABLET | Freq: Once | ORAL | Status: AC
  Administered 2023-12-26: 5 mg via ORAL
  Filled 2023-12-26: qty 1

## 2023-12-26 MED ORDER — PANTOPRAZOLE SODIUM 40 MG IV SOLR
40.0000 mg | Freq: Two times a day (BID) | INTRAVENOUS | Status: DC
  Administered 2023-12-26 – 2023-12-28 (×6): 40 mg via INTRAVENOUS
  Filled 2023-12-26 (×6): qty 10

## 2023-12-26 MED ORDER — STERILE WATER FOR INJECTION IJ SOLN
INTRAMUSCULAR | Status: AC
  Administered 2023-12-26: 10 mL
  Filled 2023-12-26: qty 10

## 2023-12-26 NOTE — Plan of Care (Signed)

## 2023-12-26 NOTE — Consult Note (Addendum)
 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 There is mild wall thickening of the descending duodenum? Reactive.  She is hemodynamically stable. Mild elevation in creatinine with normal BUN so doesn't seem hemoconcentrated today and hct 30 %.  Gallstone presumably cause of acute pancreatitis? No culprit meds. Serum Ca+ normal and no mention of pancreatic mass on CT scan.   Blair anemia Presenting hgb 11.2.  GERD Having heartburn  Chronic methadone     PLAN:   -- AM triglyceride level will be ordered -- Patient will need ERCP, probably not today however. The benefits and risks of ERCP with possible sphincterotomy not limited to cardiopulmonary complications of sedation, bleeding, infection, perforation,and pancreatitis were discussed with the patient who agrees to proceed.   --Can try clear liquids --Continue IV fluids for now --NPO after MN --Eventual surgical evaluation for consideration of cholecystectomy --Checking iron studies --Consider eventual outpatient screening colonoscopy --Pantoprazole  40 mg IV daily     HPI   Patient is a 63 year old female admitted with pancreatitis.  She gives a history of pancreatitis several years ( ? Etiology) was apparently never determined.  Several days ago she began having severe epigastric /right upper quadrant pain reminiscent of when she had pancreatitis.  Pain was associated with nausea and 1 episode of vomiting.  Patient delayed getting care because she has been taking care and visiting her husband who is  currently in the hospital and apparently in the process of dying.    In the ED yesterday her white count was 10.4, hematocrit 33%.  BUN 19, creatinine 1.08.  Alk phos 328, AST 141, ALT 69, bilirubin normal, lipase 511.   CTAP with contrast Atrophic pancreas with parenchymal calcification compatible with sequelae of chronic pancreatitis. Dilated main pancreatic duct measuring 10 mm. Mild fluid with stranding about the head of the pancreas . An 8 mm gallstone at the ampulla of vater.  No biliary duct dilation  Today hematocrit is 30.6%.  Renal function remains normal.  Alk phos slightly improved overnight.   Patient is a nondrinker.  Pertinent GI Studies   Per patient, no history of EGD nor colonoscopy   Labs and Imaging:  Recent Labs    12/25/23 2120 12/26/23 0431  PROT 6.6 6.2*  ALBUMIN 3.3* 2.9*  AST 141* 125*  ALT 69* 82*  ALKPHOS 328* 315*  BILITOT 0.6 0.6   Recent Labs    12/25/23 2118 12/25/23 2120 12/25/23 2120 12/26/23 0431  WBC  --  10.4  --  8.9  HGB 11.2* 10.7*  --  10.2*  HCT 33.0* 33.3*  --  30.6*  MCV  --  92.0   < > 89.2  PLT  --  362  --  296   < > = values in this interval not displayed.   Recent Labs    12/25/23 2118  12/25/23 2120 12/26/23 0431  NA 137 134* 138  K 4.0 4.8 4.2  CL 97* 95* 98  CO2  --  27 30  GLUCOSE 125* 125* 105*  BUN 19 19 12   CREATININE 1.20* 1.08* 0.87  CALCIUM  --  9.1 8.8*     CT ABDOMEN PELVIS W CONTRAST EXAM: CT ABDOMEN AND PELVIS WITH CONTRAST 12/25/2023 10:14:49 PM  TECHNIQUE: CT of the abdomen and pelvis was performed with the administration of intravenous contrast. Multiplanar reformatted images are provided for review. Automated exposure control, iterative reconstruction, and/or weight-based adjustment of the mA/kV was utilized to reduce the radiation dose to as low as reasonably achievable.  COMPARISON: 12/17/2005  CLINICAL HISTORY: Cholelithiasis; RLQ abdominal pain.  FINDINGS:  LOWER  CHEST: No acute abnormality.  LIVER: Mild intrahepatic biliary dilation.  GALLBLADDER AND BILE DUCTS: There is an 8 mm gallstone at the ampulla of vater. No extrahepatic biliary dilation. The common bile duct measures 7 mm. Mild gallbladder distention.  SPLEEN: No acute abnormality.  PANCREAS: Atrophic pancreas with parenchymal calcification compatible with sequelae of chronic pancreatitis. Dilated main pancreatic duct measuring 10 mm. Mild fluid with stranding about the head of the pancreas .  ADRENAL GLANDS: No acute abnormality.  KIDNEYS, URETERS AND BLADDER: No stones in the kidneys or ureters. No hydronephrosis. No perinephric or periureteral stranding. Urinary bladder is unremarkable.  GI AND BOWEL: Moderate colonic stool burden greatest in the right colon. No bowel wall thickening. No evidence of obstruction. Normal appendix. There is mild wall thickening of the descending duodenum. The findings may be related to duodenitis or, given the gallstone at the ampulla of vater, gallstone pancreatitis.  PERITONEUM AND RETROPERITONEUM: Trace fluid and stranding in the right upper quadrant adjacent to the descending portion of the duodenum. No Free intraperitoneal air.  VASCULATURE: Aorta is normal in caliber.  LYMPH NODES: No lymphadenopathy.  REPRODUCTIVE ORGANS: Hysterectomy.  BONES AND SOFT TISSUES: No acute osseous abnormality. Fat containing periumbilical hernia.  IMPRESSION: 1. Findings suggest acute on chronic pancreatitis secondary to an 8 mm gallstone at the ampulla of vater. The main pancreatic duct measures 10 mm in diameter. No organized fluid collection. 2. Moderate colonic stool burden greatest in the right colon.  Electronically signed by: Norman Gatlin MD 12/25/2023 10:29 PM EDT RP Workstation: [id number] DG Chest Portable 1 View CLINICAL DATA:  Right upper quadrant pain, chest pain  EXAM: PORTABLE CHEST 1 VIEW  COMPARISON:   09/13/2004  FINDINGS: Single frontal view of the chest emanates an unremarkable cardiac silhouette. No airspace disease, effusion, or pneumothorax. No acute bony abnormalities.  IMPRESSION: 1. No acute intrathoracic process.  Electronically Signed   By: Ozell Daring M.D.   On: 12/25/2023 21:57           Past Medical History:  Diagnosis Date   Chronic fatigue    Fibromyalgia    Pancreatitis    Trigeminal neuralgia     Past Surgical History:  Procedure Laterality Date   ABDOMINAL HYSTERECTOMY     CESAREAN SECTION      Family History  Problem Relation Age of Onset   Diabetes Mother    Coronary artery disease Father    Cancer Daughter    Cancer Son     Prior to Admission medications   Medication Sig Start Date End Date Taking? Authorizing Provider  methadone  (DOLOPHINE ) 10 MG/ML solution Take 200 mg by mouth daily.     [provider]    Current Facility-Administered Medications  Medication Dose  Route Frequency Provider Last Rate Last Admin   acetaminophen  (TYLENOL ) tablet 650 mg  650 mg Oral Q6H PRN Arthea Child, MD       Or   acetaminophen  (TYLENOL ) suppository 650 mg  650 mg Rectal Q6H PRN Arthea Child, MD       HYDROmorphone  (DILAUDID ) injection 1 mg  1 mg Intravenous Q4H PRN Arthea Child, MD   1 mg at 12/26/23 0930   lactated ringers  infusion   Intravenous Continuous Arthea Child, MD 200 mL/hr at 12/26/23 0922 New Bag at 12/26/23 9077   methadone  (DOLOPHINE ) 10 MG/ML solution 200 mg  200 mg Oral Daily Claiborne, Claudia, MD       ondansetron  (ZOFRAN ) tablet 4 mg  4 mg Oral Q6H PRN Arthea Child, MD       Or   ondansetron  (ZOFRAN ) injection 4 mg  4 mg Intravenous Q6H PRN Arthea Child, MD   4 mg at 12/26/23 0124    Allergies as of 12/25/2023 - Review Complete 12/25/2023  Allergen Reaction Noted   Doxycycline Rash 08/18/2011    Social History   Socioeconomic History   Marital status: Married    Spouse  name: Not on file   Number of children: Not on file   Years of education: Not on file   Highest education level: Not on file  Occupational History   Not on file  Tobacco Use   Smoking status: Never   Smokeless tobacco: Not on file  Substance and Sexual Activity   Alcohol use: No   Drug use: No   Sexual activity: Yes    Birth control/protection: Surgical  Other Topics Concern   Not on file  Social History Narrative   Not on file   Social Drivers of Health   Financial Resource Strain: Not on file  Food Insecurity: Not on file  Transportation Needs: Not on file  Physical Activity: Not on file  Stress: Not on file  Social Connections: Not on file  Intimate Partner Violence: Not on file     Code Status   Code Status: Full Code  Review of Systems: All systems reviewed and negative except where noted in HPI.  Physical Exam: Vital signs in last 24 hours: Temp:  [97.7 F (36.5 C)-98.3 F (36.8 C)] 97.7 F (36.5 C) (09/07 0825) Pulse Rate:  [89-105] 95 (09/07 0825) Resp:  [17-19] 17 (09/07 0825) BP: (153-167)/(83-97) 153/93 (09/07 0825) SpO2:  [98 %-100 %] 99 % (09/07 0825) FiO2 (%):  [21 %] 21 % (09/06 2340) Weight:  [61.2 kg] 61.2 kg (09/06 2102) Last BM Date : 12/21/23  General:  Pleasant female in NAD Psych:  Cooperative. Normal mood and affect Eyes: Pupils equal Ears:  Normal auditory acuity Nose: No deformity, discharge or lesions Neck:  Supple, no masses felt Lungs:  Clear to auscultation.  Heart:  Regular rate, regular rhythm.  Abdomen:  Soft, nondistended, diffuse upper abdominal tenderness , no masses felt Rectal :  Deferred Msk: Symmetrical without gross deformities.  Neurologic:  Alert, oriented, grossly normal neurologically Extremities : No edema Skin:  Intact without significant lesions.    Intake/Output from previous day: 09/06 0701 - 09/07 0700 In: 10  Out: -  Intake/Output this shift:  No intake/output data recorded.   Vina Dasen, NP-C   12/26/2023, 9:47 AM

## 2023-12-26 NOTE — Progress Notes (Signed)
 [facility] night cross cover note:   Regarding this patient who is hospitalized with acute pancreatitis, with is reporting breakthrough abdominal discomfort, and is not yet eligible for her next prn dose of IV Dilaudid .  I subsequently placed a one-time additional dose of IV Dilaudid  to address her breakthrough pain.  Additionally, the patient is requesting Ambien  for sleep.  She notes that melatonin was not effective for her.  She received a dose of Ambien  last evening, to which she responded well and was able to subsequently sleep.  I subsequently added order for prn Ambien  for insomnia.     Eva Pore, DO Hospitalist

## 2023-12-26 NOTE — Progress Notes (Signed)
 Progress Note   Patient: Mia Bartlett FMW:[id number] DOB: 1960/07/23 DOA: 12/25/2023     1 DOS: the patient was seen and examined on 12/26/2023   Brief hospital course: 63 y.o. female on chronic methadone  maintenance w h/o pancreatitis once before who presents with RUQ abdominal pain which started 4 days ago.  She has been in the hospital frequently visiting her husband who is dying.  The patient has a history of pancreatitis of unknown etiology approximately 10 years ago.  When the pain started she noted that it felt like the pancreatitis she had in the past.  She put off seeking evaluation until today when the pain became unbearable.  She denies any diarrhea.  She has had 2 episodes of vomiting today.  She is also having a lot of trouble with heartburn and severe acid reflux.  She denies any fevers or chills. She actually came down to the emergency department from her husband's hospital room. In the emergency department the patient had a CT scan of her abdomen pelvis which was consistent with pancreatitis and there was a gallstone present in the ampulla of Vater.  Her lipase was greater than 500 her AST 141 and ALT 69.  Her t. Bili was wnl at 0.6.     She was treated with IV fluids and IV Dilaudid  which did help her pain greatly.  [facility] was called by the emergency department physician.  The patient will be admitted to the hospitalist service for further management.  Assessment and Plan: Acute gallstone pancreatitis  - Continue NPO, IV fluids, pain control GI following, Plan for ERCP.    2. Grief - the patient's husband is currently hospitalized on the 6th floor and is actively dying per her report.   3.  Social -the patient says she is in the process of being evicted.  Her husband is actively dying and she has no close contacts.  No support. - Social work consult needed   4.  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 - Continue   5.  Constipation -the patient denies constipation but she also reports that she has a bowel movement about once a week.  CT also reveals a moderate stool burden.  She should probably be set up with something daily like MiraLAX  or senna given her methadone  use once her pancreatitis is cleared.        Subjective: Seen at bedside, reports ongoing epigastric pain associated nausea, denies vomiting.   Physical Exam: Vitals:   12/25/23 2340 12/26/23 0055 12/26/23 0515 12/26/23 0825  BP:  (!) 167/83 (!) 155/90 (!) 153/93  Pulse:  92 89 95  Resp:  18 18 17   Temp:  98.2 F (36.8 C) 98.2 F (36.8 C) 97.7 F (36.5 C)  TempSrc:  Oral  Oral  SpO2: 98% 100% 98% 99%  Weight:      Height:      General: No acute distress, pale HEENT: Normocephalic, atraumatic, PERRL, no teeth Cardiovascular: Normal rate and rhythm. Distal pulses intact. Pulmonary: Normal pulmonary effort, normal breath sounds Gastrointestinal: Nondistended abdomen, soft, epigastric tenderness, normoactive bowel sounds Musculoskeletal:Normal ROM, no lower ext edema Lymphadenopathy: No cervical LAD. Skin: Skin is warm and dry. Neuro: No focal deficits noted, AAOx3. PSYCH: Attentive and cooperative  Data Reviewed: Reviewed labs, imaging and Provider notes    Family Communication:   Disposition: Status is: Inpatient Remains inpatient appropriate because: Pancreatities  Planned Discharge Destination: Home  Time spent: 35 minutes  Author: Landon FORBES Baller, MD 12/26/2023 9:14 AM  For on call review [email or web address].

## 2023-12-26 NOTE — Plan of Care (Signed)
  Problem: Nutrition: Goal: Adequate nutrition will be maintained Outcome: Not Progressing   Problem: Education: Goal: Knowledge of General Education information will improve Description: Including pain rating scale, medication(s)/side effects and non-pharmacologic comfort measures Outcome: Progressing   Problem: Pain Managment: Goal: General experience of comfort will improve and/or be controlled Outcome: Progressing

## 2023-12-26 NOTE — Progress Notes (Signed)
 [facility] night cross cover note:   The patient is requesting ambien  for sleep, with the patient noting that melatonin is not effective for her. I subsequently ordered ambien  5 mg po x 1 dose now.     Eva Pore, DO Hospitalist

## 2023-12-27 ENCOUNTER — Inpatient Hospital Stay (HOSPITAL_COMMUNITY): Payer: MEDICAID

## 2023-12-27 DIAGNOSIS — K861 Other chronic pancreatitis: Secondary | ICD-10-CM | POA: Diagnosis not present

## 2023-12-27 DIAGNOSIS — K859 Acute pancreatitis without necrosis or infection, unspecified: Secondary | ICD-10-CM

## 2023-12-27 DIAGNOSIS — K851 Biliary acute pancreatitis without necrosis or infection: Secondary | ICD-10-CM | POA: Diagnosis not present

## 2023-12-27 LAB — COMPREHENSIVE METABOLIC PANEL WITH GFR
ALT: 48 U/L — ABNORMAL HIGH (ref 0–44)
AST: 37 U/L (ref 15–41)
Albumin: 3.1 g/dL — ABNORMAL LOW (ref 3.5–5.0)
Alkaline Phosphatase: 252 U/L — ABNORMAL HIGH (ref 38–126)
Anion gap: 13 (ref 5–15)
BUN: 7 mg/dL — ABNORMAL LOW (ref 8–23)
CO2: 27 mmol/L (ref 22–32)
Calcium: 8.8 mg/dL — ABNORMAL LOW (ref 8.9–10.3)
Chloride: 99 mmol/L (ref 98–111)
Creatinine, Ser: 0.99 mg/dL (ref 0.44–1.00)
GFR, Estimated: >60 mL/min (ref >60)
Glucose, Bld: 128 mg/dL — ABNORMAL HIGH (ref 70–99)
Potassium: 3.7 mmol/L (ref 3.5–5.1)
Sodium: 139 mmol/L (ref 135–145)
Total Bilirubin: 0.5 mg/dL (ref 0.0–1.2)
Total Protein: 6.4 g/dL — ABNORMAL LOW (ref 6.5–8.1)

## 2023-12-27 LAB — LIPASE, BLOOD: Lipase: 42 U/L (ref 11–51)

## 2023-12-27 LAB — TRIGLYCERIDES: Triglycerides: 91 mg/dL (ref <150)

## 2023-12-27 MED ORDER — LORAZEPAM 2 MG/ML IJ SOLN
1.0000 mg | INTRAMUSCULAR | Status: DC | PRN
  Administered 2023-12-27 – 2024-01-01 (×10): 1 mg via INTRAVENOUS
  Filled 2023-12-27 (×11): qty 1

## 2023-12-27 MED ORDER — GADOBUTROL 1 MMOL/ML IV SOLN
6.0000 mL | Freq: Once | INTRAVENOUS | Status: AC | PRN
  Administered 2023-12-27: 6 mL via INTRAVENOUS

## 2023-12-27 MED ORDER — INFLUENZA VIRUS VACC SPLIT PF (FLUZONE) 0.5 ML IM SUSY
0.5000 mL | PREFILLED_SYRINGE | INTRAMUSCULAR | Status: AC
  Administered 2023-12-31: 0.5 mL via INTRAMUSCULAR
  Filled 2023-12-27: qty 0.5

## 2023-12-27 MED ORDER — METHADONE HCL 10 MG PO TABS
200.0000 mg | ORAL_TABLET | Freq: Every day | ORAL | Status: DC
  Administered 2023-12-27 – 2024-01-01 (×6): 200 mg via ORAL
  Filled 2023-12-27 (×8): qty 20

## 2023-12-27 NOTE — Progress Notes (Signed)
 Progress Note Patient: Mia Bartlett FMW:[id number] DOB: 12-03-1960 DOA: 12/25/2023     2 DOS: the patient was seen and examined on 12/27/2023   Brief hospital course: Mia Bartlett is a 63 y.o. female on chronic methadone  maintenance w h/o pancreatitis once before who presents with RUQ abdominal pain which started 4 days ago. She is currently undergoing treatment for suspected gallstone pancreatitis. GI is on board and tentatively scheduled for ERCP today. She is otherwise being managed symptomatically.  Of note, patient's husband was also in the hospital and passed away this morning. She was at his bedside at the time of passing and requests chaplain visit.     Assessment and Plan: Acute gallstone pancreatitis  - Continue NPO, IV fluids, pain control GI following, Plan for ERCP.   2. Grief - chaplain has been consulted per patient request. Comfort offered. PRN ativan  also ordered.    3.  Social -the patient says she is in the process of being evicted.   - TOC consulted.    4.  Chronic methadone  maintenance x 5 years  - Continue   5. Constipation- chronic. Not acutely aggravating her - bowel regimen PRN   Subjective: pt reports RUQ and epigastric pain. Nausea as well. She is just returning to her room from visiting her husband who passed away when she was at bedside. We talk about him and her dog and she shows me pictures.   Physical Exam: Vitals:   12/26/23 0055 12/26/23 0515 12/26/23 0825 12/26/23 2100  BP: (!) 167/83 (!) 155/90 (!) 153/93 (!) 161/91  Pulse: 92 89 95 96  Resp: 18 18 17 18   Temp: 98.2 F (36.8 C) 98.2 F (36.8 C) 97.7 F (36.5 C) 98.6 F (37 C)  TempSrc: Oral  Oral Axillary  SpO2: 100% 98% 99% 99%  Weight:      Height:      General: in acute grief and appears uncomfortable.  HEENT: Normocephalic, atraumatic, PERRL, no teeth Cardiovascular: Normal rate and rhythm. Distal pulses intact. Pulmonary: Normal pulmonary effort, normal breath  sounds Gastrointestinal: Nondistended abdomen, soft, epigastric tenderness, normoactive bowel sounds Musculoskeletal:Normal ROM, no lower ext edema Lymphadenopathy: No cervical LAD. Skin: Skin is warm and dry. Neuro: No focal deficits noted, AAOx3. PSYCH: Attentive and cooperative  Data Reviewed:    Latest Ref Rng & Units 12/26/2023    4:31 AM 12/25/2023    9:20 PM 12/25/2023    9:18 PM  CBC  WBC 4.0 - 10.5 K/uL 8.9  10.4    Hemoglobin 12.0 - 15.0 g/dL 89.7  89.2  88.7   Hematocrit 36.0 - 46.0 % 30.6  33.3  33.0   Platelets 150 - 400 K/uL 296  362         Latest Ref Rng & Units 12/26/2023    4:31 AM 12/25/2023    9:20 PM 12/25/2023    9:18 PM  BMP  Glucose 70 - 99 mg/dL 894  874  874   BUN 8 - 23 mg/dL 12  19  19    Creatinine 0.44 - 1.00 mg/dL 9.12  8.91  8.79   Sodium 135 - 145 mmol/L 138  134  137   Potassium 3.5 - 5.1 mmol/L 4.2  4.8  4.0   Chloride 98 - 111 mmol/L 98  95  97   CO2 22 - 32 mmol/L 30  27    Calcium 8.9 - 10.3 mg/dL 8.8  9.1     Family Communication: patient states she has no one  that she would want to be contacted and updated  Disposition: Status is: Inpatient Remains inpatient appropriate because: Pancreatities  Planned Discharge Destination: Home    Time spent: 45 minutes  Author: Marien LITTIE Piety, MD 12/27/2023 7:09 AM  For on call review [email or web address].

## 2023-12-27 NOTE — Progress Notes (Addendum)
    Patient with a history of chronic pancreatitis presents with acute pancreatitis.  Initial CT scan raise question of a stone in the ampulla, with calcifications and a dilated pancreatic duct but a nondilated bile duct.  My review of the scan made me think that the calcification originally interpreted as a gallstone in the bile duct was probably more likely a pancreatic calcification or perhaps even a pancreatic duct stone.  We ordered an MR MRCP.  Unfortunately there was motion artifact so biliary imaging was not adequate.  I have reviewed both images with Dr. Ramond of radiology.  He agrees with me that the calcification is not in the bile duct and is along the dorsal pancreatic duct potentially in the duct but not a bile duct stone.  The patient has right upper quadrant right lower quadrant pain and pain radiating around into the right infrascapular region.  She has a history of recurrent pancreatitis and says this is classic for her pancreatitis pain.  She is hungry and asking for food.  Physical exam shows her to be mildly tender in the right upper quadrant and right lower quadrant.  Bowel sounds are present.    Assessment and plan:  Acute on chronic pancreatitis without clear evidence of choledocholithiasis.  Mild elevation in transaminases and elevated alkaline phosphatase normal bilirubin nondilated bile duct.   At this point will allow clear liquids again await recheck of labs today and tomorrow.  I think an endoscopic ultrasound would be important for her though timing of this not entirely clear that will depend on clinical course.  Will hold off on ERCP at this time.  I do not think there is a clear indication for it nor do I think risk-benefit ratio favors it.  Reassess tomorrow.  Consider advancing diet depending upon clinical course.  Will inquire about availability of endoscopic ultrasound and potential timing of that.   Note, unfortunately her husband died earlier  today (he was on [address]   Lupita CHARLENA Commander, MD, [facility] See [facility] on call - gastroenterology for best contact person 12/27/2023 2:16 PM  Reviewed and discussed case with Dr. Wilhelmenia our advanced endoscopist.  Will go ahead and get an ultrasound to see if she has cholelithiasis Given the motion artifact on MRCP .  This was mentioned on the indication for the CT but there is no finding of cholelithiasis on any imaging so far.

## 2023-12-27 NOTE — Progress Notes (Signed)
 Patient is off the floor. Currently visiting husband on [address] Room 24. Patient was informed husband might soon pass away.

## 2023-12-27 NOTE — TOC Initial Note (Signed)
 Transition of Care [facility]) - Initial/Assessment Note    Patient Details  Name: Mia Bartlett MRN: [id number] Date of Birth: 12/12/1960  Transition of Care [facility]) CM/SW Contact:    Mickeal Daws A [place], LCSW Phone Number: 12/27/2023, 1:27 PM  Clinical Narrative:                  CSW met with pt at bedside. She is from home with her spouse, who was also admitted to [facility] and recently deceased. Pt informed CSW of significant social issues. Pt has eviction notice at her late spouses home, working with Firefighter, on plan for temporary return home to get belongings and her dog and proceed with temporary housing plan from there. She said that she does not have support from her 3 children, a daughter and 2 sons and is now navigating her circumstances alone.  She reported that Nat Chang with DSS [facility], [phone], has been working with her in the community. She said that Nat would help pt with housing information, food stamps and steps on obtaining pt's survivor's benefits, as pt has no income.   Pt also informed CSW she receives care at the [facility]adone  Clinic in [place] called [facility] and has a counselor, Bard, [phone].  Pt requested information on cremation services and any financial assistance that could be provided. CSW will follow up regarding.  Social service and shelter resources provided on pt's chart.   Plan for CSW to reach out to Nat Chang for collateral information and process/length of time to collect survivor benefits.Tentative disposition for plan to return to home and continue follow up with DSS Nat Chang in community to continue with social assistance.  Pt will need assistance for transportation at discharge.   CSW will continue to follow.   Expected Discharge Plan:  (Home or Hotel) Barriers to Discharge: Continued Medical Work up, Family Issues   Patient Goals and CMS Choice Patient states their goals for this hospitalization  and ongoing recovery are:: go home and get my dog and get stable CMS Medicare.gov Compare Post Acute Care list provided to:: Patient Choice offered to / list presented to : Patient      Expected Discharge Plan and Services In-house Referral: Clinical Social Work   Post Acute Care Choice: NA Living arrangements for the past 2 months: Single Family Home                                      Prior Living Arrangements/Services Living arrangements for the past 2 months: Single Family Home Lives with:: Self, Pets Patient language and need for interpreter reviewed:: Yes        Need for Family Participation in Patient Care: No (Comment) Care giver support system in place?: Yes (comment) (pt has Child psychotherapist at [employer], Nat Chang)   Criminal Activity/Legal Involvement Pertinent to Current Situation/Hospitalization: No - Comment as needed  Activities of Daily Living   ADL Screening (condition at time of admission) Independently performs ADLs?: Yes (appropriate for developmental age) Is the patient deaf or have difficulty hearing?: No Does the patient have difficulty seeing, even when wearing glasses/contacts?: Yes (Needs reading glasses.) Does the patient have difficulty concentrating, remembering, or making decisions?: No  Permission Sought/Granted                  Emotional Assessment Appearance:: Appears older than stated age Attitude/Demeanor/Rapport: Engaged  Affect (typically observed): Sad Orientation: : Oriented to Self, Oriented to Place, Oriented to  Time, Oriented to Situation Alcohol / Substance Use:  (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 [place] [address] Phone: [phone] Fax: [phone]     Social Drivers of Health (SDOH) Social History: SDOH Screenings   Food Insecurity: Food Insecurity Present (12/26/2023)  Housing: High Risk (12/26/2023)  Transportation Needs: No Transportation Needs (12/26/2023)  Utilities: At Risk (12/26/2023)  Tobacco Use: Unknown (12/25/2023)   SDOH Interventions:     Readmission Risk Interventions     No data to display
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
## 2023-12-27 NOTE — Progress Notes (Signed)
 Per MD note pt is to be NPO. Order placed

## 2023-12-27 NOTE — Progress Notes (Signed)
 [facility] ROUNDING NOTE   Subjective: Abdominal pain - Severe intensity - Localized to the right side of the abdomen - Radiates to the back - Exacerbated by deep breathing    Objective: Vital signs in last 24 hours: Temp:  [98.1 F (36.7 C)-98.6 F (37 C)] 98.1 F (36.7 C) (09/08 1001) Pulse Rate:  [95-96] 95 (09/08 1001) Resp:  [18-19] 19 (09/08 1001) BP: (138-161)/(91-92) 138/92 (09/08 1001) SpO2:  [98 %-99 %] 98 % (09/08 1001) Last BM Date : 12/21/23 General: NAD Lungs: Clear Heart: S1-S2, regular rate and rhythm Abdomen: Soft, no distention, epigastric and right upper quadrant discomfort, no rebound, bowel sounds present Ext: No edema    Intake/Output from previous day: 09/07 0701 - 09/08 0700 In: 1972.7 [I.V.:1972.7] Out: -  Intake/Output this shift: No intake/output data recorded.   Lab Results: Recent Labs    12/25/23 2118 12/25/23 2120 12/26/23 0431  WBC  --  10.4 8.9  HGB 11.2* 10.7* 10.2*  PLT  --  362 296  MCV  --  92.0 89.2   BMET Recent Labs    12/25/23 2118 12/25/23 2120 12/26/23 0431  NA 137 134* 138  K 4.0 4.8 4.2  CL 97* 95* 98  CO2  --  27 30  GLUCOSE 125* 125* 105*  BUN 19 19 12   CREATININE 1.20* 1.08* 0.87  CALCIUM  --  9.1 8.8*   LFT Recent Labs    12/25/23 2120 12/26/23 0431  PROT 6.6 6.2*  ALBUMIN 3.3* 2.9*  AST 141* 125*  ALT 69* 82*  ALKPHOS 328* 315*  BILITOT 0.6 0.6   PT/INR No results for input(s): INR in the last 72 hours.    Imaging/Other results: CT ABDOMEN PELVIS W CONTRAST Result Date: 12/25/2023 EXAM: CT ABDOMEN AND PELVIS WITH CONTRAST 12/25/2023 10:14:49 PM TECHNIQUE: CT of the abdomen and pelvis was performed with the administration of intravenous contrast. Multiplanar reformatted images are provided for review. Automated exposure control, iterative reconstruction, and/or weight-based adjustment of the mA/kV was utilized to reduce the radiation dose to as low as reasonably  achievable. COMPARISON: 12/17/2005 CLINICAL HISTORY: Cholelithiasis; RLQ abdominal pain. FINDINGS: LOWER CHEST: No acute abnormality. LIVER: Mild intrahepatic biliary dilation. GALLBLADDER AND BILE DUCTS: There is an 8 mm gallstone at the ampulla of vater. No extrahepatic biliary dilation. The common bile duct measures 7 mm. Mild gallbladder distention. SPLEEN: No acute abnormality. PANCREAS: Atrophic pancreas with parenchymal calcification compatible with sequelae of chronic pancreatitis. Dilated main pancreatic duct measuring 10 mm. Mild fluid with stranding about the head of the pancreas . ADRENAL GLANDS: No acute abnormality. KIDNEYS, URETERS AND BLADDER: No stones in the kidneys or ureters. No hydronephrosis. No perinephric or periureteral stranding. Urinary bladder is unremarkable. GI AND BOWEL: Moderate colonic stool burden greatest in the right colon. No bowel wall thickening. No evidence of obstruction. Normal appendix. There is mild wall thickening of the descending duodenum. The findings may be related to duodenitis or, given the gallstone at the ampulla of vater, gallstone pancreatitis. PERITONEUM AND RETROPERITONEUM: Trace fluid and stranding in the right upper quadrant adjacent to the descending portion of the duodenum. No Free intraperitoneal air. VASCULATURE: Aorta is normal in caliber. LYMPH NODES: No lymphadenopathy. REPRODUCTIVE ORGANS: Hysterectomy. BONES AND SOFT TISSUES: No acute osseous abnormality. Fat containing periumbilical hernia. IMPRESSION: 1. Findings suggest acute on chronic pancreatitis secondary to an 8 mm gallstone at the ampulla of vater. The main pancreatic duct measures 10 mm in diameter. No organized fluid collection. 2. Moderate  colonic stool burden greatest in the right colon. Electronically signed by: Norman Gatlin MD 12/25/2023 10:29 PM EDT RP Workstation: [id number]   DG Chest Portable 1 View Result Date: 12/25/2023 CLINICAL DATA:  Right upper quadrant pain, chest pain  EXAM: PORTABLE CHEST 1 VIEW COMPARISON:  09/13/2004 FINDINGS: Single frontal view of the chest emanates an unremarkable cardiac silhouette. No airspace disease, effusion, or pneumothorax. No acute bony abnormalities. IMPRESSION: 1. No acute intrathoracic process. Electronically Signed   By: Ozell Daring M.D.   On: 12/25/2023 21:57      Assessment &Plan  63 year old female with severe right upper quadrant and epigastric abdominal pain with elevated LFTs, acute on chronic calcific pancreatitis, noted 8 mm gallstone at the ampulla, no associated bile duct dilation, has pancreatic duct dilation associated with features of chronic pancreatitis  Will obtain MRI MRCP for further evaluation ERCP pending MRI findings, discussed with Dr. Denna backup  Clear liquid diet as tolerated Pain control IV fluids Monitor daily LFT  GI will continue to follow along   This visit required >35 minutes of patient care (this includes precharting, chart review, review of results, face-to-face time used for counseling as well as treatment plan and follow-up. The patient was provided an opportunity to ask questions and all were answered. The patient agreed with the plan and demonstrated an understanding of the instructions.    LOIS Wilkie Mcgee , MD [phone]  [facility]

## 2023-12-27 NOTE — Plan of Care (Signed)
   Problem: Education: Goal: Knowledge of General Education information will improve Description Including pain rating scale, medication(s)/side effects and non-pharmacologic comfort measures Outcome: Progressing

## 2023-12-28 ENCOUNTER — Inpatient Hospital Stay (HOSPITAL_COMMUNITY): Payer: MEDICAID

## 2023-12-28 DIAGNOSIS — K859 Acute pancreatitis without necrosis or infection, unspecified: Secondary | ICD-10-CM

## 2023-12-28 DIAGNOSIS — K861 Other chronic pancreatitis: Secondary | ICD-10-CM | POA: Diagnosis not present

## 2023-12-28 DIAGNOSIS — G8929 Other chronic pain: Secondary | ICD-10-CM | POA: Diagnosis not present

## 2023-12-28 DIAGNOSIS — K851 Biliary acute pancreatitis without necrosis or infection: Secondary | ICD-10-CM | POA: Diagnosis not present

## 2023-12-28 LAB — COMPREHENSIVE METABOLIC PANEL WITH GFR
ALT: 43 U/L (ref 0–44)
AST: 27 U/L (ref 15–41)
Albumin: 3 g/dL — ABNORMAL LOW (ref 3.5–5.0)
Alkaline Phosphatase: 230 U/L — ABNORMAL HIGH (ref 38–126)
Anion gap: 15 (ref 5–15)
BUN: 6 mg/dL — ABNORMAL LOW (ref 8–23)
CO2: 27 mmol/L (ref 22–32)
Calcium: 9 mg/dL (ref 8.9–10.3)
Chloride: 99 mmol/L (ref 98–111)
Creatinine, Ser: 1.01 mg/dL — ABNORMAL HIGH (ref 0.44–1.00)
GFR, Estimated: >60 mL/min (ref >60)
Glucose, Bld: 94 mg/dL (ref 70–99)
Potassium: 4.1 mmol/L (ref 3.5–5.1)
Sodium: 141 mmol/L (ref 135–145)
Total Bilirubin: 0.5 mg/dL (ref 0.0–1.2)
Total Protein: 6.3 g/dL — ABNORMAL LOW (ref 6.5–8.1)

## 2023-12-28 LAB — CBC
HCT: 36.9 % (ref 36.0–46.0)
Hemoglobin: 11.8 g/dL — ABNORMAL LOW (ref 12.0–15.0)
MCH: 29.3 pg (ref 26.0–34.0)
MCHC: 32 g/dL (ref 30.0–36.0)
MCV: 91.6 fL (ref 80.0–100.0)
Platelets: 365 K/uL (ref 150–400)
RBC: 4.03 MIL/uL (ref 3.87–5.11)
RDW: 12.8 % (ref 11.5–15.5)
WBC: 7.8 K/uL (ref 4.0–10.5)
nRBC: 0 % (ref 0.0–0.2)

## 2023-12-28 MED ORDER — DIPHENHYDRAMINE HCL 25 MG PO CAPS
25.0000 mg | ORAL_CAPSULE | Freq: Four times a day (QID) | ORAL | Status: DC | PRN
  Administered 2023-12-28: 25 mg via ORAL
  Filled 2023-12-28: qty 1

## 2023-12-28 MED ORDER — GABAPENTIN 100 MG PO CAPS
100.0000 mg | ORAL_CAPSULE | Freq: Three times a day (TID) | ORAL | Status: DC
  Administered 2023-12-28 – 2024-01-01 (×12): 100 mg via ORAL
  Filled 2023-12-28 (×12): qty 1

## 2023-12-28 MED ORDER — PHENYLEPHRINE HCL-NACL 20-0.9 MG/250ML-% IV SOLN
INTRAVENOUS | Status: AC
  Filled 2023-12-28: qty 500

## 2023-12-28 NOTE — Progress Notes (Signed)
 [facility] night cross cover note:   Patient reports recent shingles, without active vesicles. Adding gabapentin  for postherpetic neuralgia and prn benadryl  for some pruritus.    Eva Pore, DO Hospitalist

## 2023-12-28 NOTE — Progress Notes (Addendum)
 Patient ID: Mia Bartlett, female   DOB: [birth year], 63 y.o.   MRN: [id number]    Progress Note   Subjective  Day # 3 CC; acute on chronic pancreatitis  Abdominal ultrasound done report pending MRI/MRCP yesterday-motion artifact, 6 mm calcification along the dorsal pancreatic duct in the vicinity of the ampulla and with associated dilation of the dorsal pancreatic duct on CT and less well-appreciated on MRCP no biliary dilation, no mass lesion  Labs-lipase 42 WBC 7.8/hemoglobin 11.8/hematocrit 36.9 BUN 6/creatinine 1.01 LFTs normal with exception of alk phos of 230  Patient says her abdominal pain is about the same today still having fairly constant pain across the upper abdomen, no vomiting, says that she would like to try some solid food.  He has been getting her methadone  and narcotics in addition but does not feel that her pain control has been very good  Patient worried that she has shingles on the right buttocks, having burning itching pain similar to prior episode of shingles.   Objective   Vital signs in last 24 hours: Temp:  [97.7 F (36.5 C)-98.9 F (37.2 C)] 98.9 F (37.2 C) (09/09 1220) Pulse Rate:  [95-100] 95 (09/09 1220) Resp:  [15-18] 15 (09/09 1220) BP: (136-141)/(92-100) 136/95 (09/09 1220) SpO2:  [97 %-100 %] 97 % (09/09 1220) Last BM Date : 12/24/23 General:   Older white female in NAD Heart:  Regular rate and rhythm; no murmurs Lungs: Respirations even and unlabored, lungs CTA bilaterally Abdomen:  Soft, there is tenderness across the upper abdomen no guarding or rebound ,nondistended. Normal bowel sounds. Extremities:  Without edema.  Has 3 small erythematous papules on the right buttocks Neurologic:  Alert and oriented,  grossly normal neurologically. Psych:  Cooperative. Normal mood and affect.  Intake/Output from previous day: 09/08 0701 - 09/09 0700 In: 480 [P.O.:480] Out: 1000 [Urine:1000] Intake/Output this shift: Total I/O In: -  Out: 700  [Urine:700]  Lab Results: Recent Labs    12/25/23 2120 12/26/23 0431 12/28/23 0602  WBC 10.4 8.9 7.8  HGB 10.7* 10.2* 11.8*  HCT 33.3* 30.6* 36.9  PLT 362 296 365   BMET Recent Labs    12/26/23 0431 12/27/23 1524 12/28/23 0602  NA 138 139 141  K 4.2 3.7 4.1  CL 98 99 99  CO2 30 27 27   GLUCOSE 105* 128* 94  BUN 12 7* 6*  CREATININE 0.87 0.99 1.01*  CALCIUM 8.8* 8.8* 9.0   LFT Recent Labs    12/28/23 0602  PROT 6.3*  ALBUMIN 3.0*  AST 27  ALT 43  ALKPHOS 230*  BILITOT 0.5   PT/INR No results for input(s): LABPROT, INR in the last 72 hours.  Studies/Results: MR ABDOMEN MRCP W WO CONTAST Result Date: 12/27/2023 CLINICAL DATA:  Pancreatitis EXAM: MRI ABDOMEN WITHOUT AND WITH CONTRAST (INCLUDING MRCP) TECHNIQUE: Multiplanar multisequence MR imaging of the abdomen was performed both before and after the administration of intravenous contrast. Heavily T2-weighted images of the biliary and pancreatic ducts were obtained, and three-dimensional MRCP images were rendered by post processing. CONTRAST:  6mL GADAVIST  GADOBUTROL  1 MMOL/ML IV SOLN COMPARISON:  CT scan 12/25/2023 FINDINGS: Despite efforts by the technologist and patient, motion artifact is present on today's exam and could not be eliminated. This reduces exam sensitivity and specificity. This is a common outcome when MRCP is attempted in the inpatient setting where patients are less likely to be able to breath hold and cooperate in controlling motion. Lower chest: Unremarkable Hepatobiliary: Biliary system and pancreatic  duct blurred by motion artifact on the MRCP images most other sequences. No significant abnormal enhancing pancreatic parenchymal lesion is identified. Pancreas: Based on the prior CT examination there is a 6 mm calcification along the dorsal pancreatic duct in the vicinity of the ampulla with associated dilation of the dorsal pancreatic duct, this is less readily appreciable on today's MRCP. No  well-defined focal pancreatic lesion identified. Spleen:  Unremarkable Adrenals/Urinary Tract:  Unremarkable Stomach/Bowel: Prominent stool throughout the colon favors constipation. Vascular/Lymphatic:  Abdominal aortic atherosclerosis noted. Other:  No supplemental non-categorized findings. Musculoskeletal: Supraumbilical hernia contains adipose tissue and a small amount of fluid. Lower lumbar degenerative disc disease. IMPRESSION: 1. Despite efforts by the technologist and patient, motion artifact is present on today's exam and could not be eliminated. This reduces exam sensitivity and specificity. 2. Based on the prior CT examination there is a 6 mm calcification along the dorsal pancreatic duct in the vicinity of the ampulla with associated dilation of the dorsal pancreatic duct, this is less readily appreciable on today's MRCP images due to motion artifact. No well-defined focal pancreatic lesion is identified. No biliary dilatation observed 3. Prominent stool throughout the colon favors constipation. 4. Supraumbilical hernia contains adipose tissue and a small amount of fluid. 5. Lower lumbar degenerative disc disease. 6. Aortic Atherosclerosis (ICD10-I70.0). Electronically Signed   By: Ryan Salvage M.D.   On: 12/27/2023 12:50   MR 3D Recon At Scanner Result Date: 12/27/2023 CLINICAL DATA:  Pancreatitis EXAM: MRI ABDOMEN WITHOUT AND WITH CONTRAST (INCLUDING MRCP) TECHNIQUE: Multiplanar multisequence MR imaging of the abdomen was performed both before and after the administration of intravenous contrast. Heavily T2-weighted images of the biliary and pancreatic ducts were obtained, and three-dimensional MRCP images were rendered by post processing. CONTRAST:  6mL GADAVIST  GADOBUTROL  1 MMOL/ML IV SOLN COMPARISON:  CT scan 12/25/2023 FINDINGS: Despite efforts by the technologist and patient, motion artifact is present on today's exam and could not be eliminated. This reduces exam sensitivity and specificity.  This is a common outcome when MRCP is attempted in the inpatient setting where patients are less likely to be able to breath hold and cooperate in controlling motion. Lower chest: Unremarkable Hepatobiliary: Biliary system and pancreatic duct blurred by motion artifact on the MRCP images most other sequences. No significant abnormal enhancing pancreatic parenchymal lesion is identified. Pancreas: Based on the prior CT examination there is a 6 mm calcification along the dorsal pancreatic duct in the vicinity of the ampulla with associated dilation of the dorsal pancreatic duct, this is less readily appreciable on today's MRCP. No well-defined focal pancreatic lesion identified. Spleen:  Unremarkable Adrenals/Urinary Tract:  Unremarkable Stomach/Bowel: Prominent stool throughout the colon favors constipation. Vascular/Lymphatic:  Abdominal aortic atherosclerosis noted. Other:  No supplemental non-categorized findings. Musculoskeletal: Supraumbilical hernia contains adipose tissue and a small amount of fluid. Lower lumbar degenerative disc disease. IMPRESSION: 1. Despite efforts by the technologist and patient, motion artifact is present on today's exam and could not be eliminated. This reduces exam sensitivity and specificity. 2. Based on the prior CT examination there is a 6 mm calcification along the dorsal pancreatic duct in the vicinity of the ampulla with associated dilation of the dorsal pancreatic duct, this is less readily appreciable on today's MRCP images due to motion artifact. No well-defined focal pancreatic lesion is identified. No biliary dilatation observed 3. Prominent stool throughout the colon favors constipation. 4. Supraumbilical hernia contains adipose tissue and a small amount of fluid. 5. Lower lumbar degenerative disc disease. 6.  Aortic Atherosclerosis (ICD10-I70.0). Electronically Signed   By: Ryan Salvage M.D.   On: 12/27/2023 12:50       Assessment / Plan:    #6 63 year old  female with history of chronic pancreatitis/idiopathic admitted with severe right upper quadrant and epigastric pain elevated LFTs and on imaging found to have acute on chronic calcific pancreatitis uncomplicated.  There was concern for possible 8 mm stone at the ampulla with main pancreatic duct measuring 10 mm   MRI/MRCP yesterday with motion artifact, no well-defined focal pancreatic lesion noted no biliary ductal dilation noted in the previous 6 mm calcification along the dorsal pancreatic duct in the vicinity of the ampulla less well-seen on MRCP  Abdominal ultrasound-normal, no gallstones, and CBD 6 mm  No clear evidence at this time for CBD stone as a precipitating factor for this bout of pancreatitis  #2 chronic pain syndrome/chronic methadone  use #3 fibromyalgia #4 grief-patient's husband died earlier this week #5 housing instability  Plan; will try advancing to soft low-fat diet No indication for ERCP at this time.  Discussed with the patient that she does not have any complicating features to her pancreatitis at this time, and does not need any endoscopic evaluation at this time.  She seems relieved.  Encouraged her that once she can keep down p.o.'s and if pain managed she will be able to be discharged over the next couple of days.   Principal Problem:   Acute gallstone pancreatitis Active Problems:   Acute on chronic pancreatitis (HCC)     LOS: 3 days   Amy Esterwood PA-C 12/28/2023, 1:16 PM situation she is  .  Results out of the hospita  Attending physician's note   I personally saw the patient and performed a substantive portion of the medical decision making process for this encounter (including a complete performance of the key components : MDM, Hx and Exam), in conjunction with the APP.  I agree with the APP's note, impression, and  the management plan for the number and complexity of problems addressed at the encounter for the patient and take responsibility for that  plan with its inherent risk of complications, morbidity, or mortality with additional input as follows.     Acute on chronic pancreatitis No gallstones on abdominal ultrasound, MRI with features of chronic pancreatitis and calcification, likely stone in pancreatic duct rather than biliary  On exam abdomen soft, mild upper abdominal tenderness Advance diet as tolerated Continue supportive care with pain management Patient is also grieving from loss of her husband  GI will continue to follow along  LOIS Wilkie Mcgee , MD [phone]
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
## 2023-12-28 NOTE — Plan of Care (Signed)
  Problem: Activity: Goal: Risk for activity intolerance will decrease Outcome: Progressing   Problem: Nutrition: Goal: Adequate nutrition will be maintained Outcome: Progressing   Problem: Coping: Goal: Level of anxiety will decrease Outcome: Progressing   Problem: Pain Managment: Goal: General experience of comfort will improve and/or be controlled Outcome: Progressing   Problem: Safety: Goal: Ability to remain free from injury will improve Outcome: Progressing

## 2023-12-28 NOTE — Plan of Care (Signed)

## 2023-12-28 NOTE — TOC Progression Note (Signed)
 Transition of Care [facility]) - Progression Note    Patient Details  Name: [name] MRN: [id number] Date of Birth: 17-Nov-1960  [facility]) CM/SW Contact  Lauraine FORBES Saa, LCSWA Phone Number: 12/28/2023, 4:01 PM  Clinical Narrative:     4:01 PM CSW introduced self and role to patient. CSW provided additional SDOH (food, housing, utilities) resources. CSW informed patient's APS social worker, Etha Chang [phone]) of unit CSW assignment. Per chart review, patient has insurance but does not have a PCP. Patient does not have SNF/HH/DME history. Patient's preferred pharmacy's are [facility] and [facility] [phone]. TOC will continue to follow and be available to assist.   Expected Discharge Plan:  (Home or Hotel) Barriers to Discharge: Continued Medical Work up, Family Issues               Expected Discharge Plan and Services In-house Referral: Clinical Social Work   Post Acute Care Choice: NA Living arrangements for the past 2 months: Single Family Home                                       Social Drivers of Health (SDOH) Interventions SDOH Screenings   Food Insecurity: Food Insecurity Present (12/26/2023)  Housing: High Risk (12/26/2023)  Transportation Needs: No Transportation Needs (12/26/2023)  Utilities: At Risk (12/26/2023)  Tobacco Use: Unknown (12/25/2023)    Readmission Risk Interventions     No data to display

## 2023-12-28 NOTE — Progress Notes (Signed)
 Progress Note Patient: Mia Bartlett FMW:[id number] DOB: [birth year] DOA: 12/25/2023     3 DOS: the patient was seen and examined on 12/28/2023   Brief hospital course: Sawyer Kahan is a 63 y.o. female on chronic methadone  maintenance w h/o pancreatitis once before who presents with RUQ abdominal pain which started 4 days ago. She is currently undergoing treatment for suspected gallstone pancreatitis.  GI is on board and scheduled for RUQ US  today. She is otherwise being managed symptomatically. Considering ERCP.  Of note, patient's husband was also in the hospital and passed away yesterday morning. She was at his bedside at the time of passing and appreciates chaplain visit.     Assessment and Plan: Acute gallstone pancreatitis  - analgesia PRN GI following, RUQ US  today. Following for results. Possible ERCP   2. Grief - chaplain has been consulted per patient request. Comfort offered. PRN ativan  also ordered.    3.  Social -the patient says she is in the process of being evicted.   - TOC consulted.    4.  Chronic methadone  maintenance x 5 years  - Continue   5. Constipation- chronic. Not acutely aggravating her - bowel regimen PRN  Subjective: pt reports RUQ and epigastric pain. Worsened today.   Physical Exam: Vitals:   12/26/23 2100 12/27/23 1001 12/27/23 2054 12/28/23 0547  BP: (!) 161/91 (!) 138/92 (!) 137/92 (!) (P) 143/92  Pulse: 96 95 97 (P) 100  Resp: 18 19 18  (P) 16  Temp: 98.6 F (37 C) 98.1 F (36.7 C) 97.7 F (36.5 C) (P) 98.7 F (37.1 C)  TempSrc: Axillary Oral Oral (P) Oral  SpO2: 99% 98% 98% (P) 100%  Weight:      Height:      General: in acute grief and appears uncomfortable.  HEENT: Normocephalic, atraumatic, PERRL, no teeth Cardiovascular: Normal rate and rhythm. Distal pulses intact. Pulmonary: Normal pulmonary effort, normal breath sounds Gastrointestinal: Nondistended abdomen, soft, epigastric tenderness, normoactive bowel  sounds Musculoskeletal:Normal ROM, no lower ext edema Lymphadenopathy: No cervical LAD. Skin: Skin is warm and dry. Neuro: No focal deficits noted, AAOx3. PSYCH: Attentive and cooperative  Data Reviewed:    Latest Ref Rng & Units 12/28/2023    6:02 AM 12/26/2023    4:31 AM 12/25/2023    9:20 PM  CBC  WBC 4.0 - 10.5 K/uL 7.8  8.9  10.4   Hemoglobin 12.0 - 15.0 g/dL 88.1  89.7  89.2   Hematocrit 36.0 - 46.0 % 36.9  30.6  33.3   Platelets 150 - 400 K/uL 365  296  362        Latest Ref Rng & Units 12/27/2023    3:24 PM 12/26/2023    4:31 AM 12/25/2023    9:20 PM  BMP  Glucose 70 - 99 mg/dL 871  894  874   BUN 8 - 23 mg/dL 7  12  19    Creatinine 0.44 - 1.00 mg/dL 9.00  9.12  8.91   Sodium 135 - 145 mmol/L 139  138  134   Potassium 3.5 - 5.1 mmol/L 3.7  4.2  4.8   Chloride 98 - 111 mmol/L 99  98  95   CO2 22 - 32 mmol/L 27  30  27    Calcium 8.9 - 10.3 mg/dL 8.8  8.8  9.1    Family Communication: patient states she has no one that she would want to be contacted and updated  Disposition: Status is: Inpatient Remains inpatient appropriate because: Pancreatities  Planned Discharge Destination: Home    Time spent: 45 minutes  Author: Marien LITTIE Piety, MD 12/28/2023 7:07 AM  For on call review [email or web address].

## 2023-12-29 DIAGNOSIS — B029 Zoster without complications: Secondary | ICD-10-CM

## 2023-12-29 DIAGNOSIS — K851 Biliary acute pancreatitis without necrosis or infection: Secondary | ICD-10-CM | POA: Diagnosis not present

## 2023-12-29 MED ORDER — PANTOPRAZOLE SODIUM 40 MG PO TBEC
40.0000 mg | DELAYED_RELEASE_TABLET | Freq: Two times a day (BID) | ORAL | Status: DC
Start: 2023-12-29 — End: 2024-01-01
  Administered 2023-12-29 – 2024-01-01 (×7): 40 mg via ORAL
  Filled 2023-12-29 (×7): qty 1

## 2023-12-29 MED ORDER — VALACYCLOVIR HCL 500 MG PO TABS
1000.0000 mg | ORAL_TABLET | Freq: Three times a day (TID) | ORAL | Status: DC
  Administered 2023-12-29 – 2024-01-01 (×10): 1000 mg via ORAL
  Filled 2023-12-29 (×13): qty 2

## 2023-12-29 MED ORDER — POLYETHYLENE GLYCOL 3350 17 G PO PACK
17.0000 g | PACK | Freq: Two times a day (BID) | ORAL | Status: DC
  Administered 2023-12-29 – 2023-12-30 (×2): 17 g via ORAL
  Filled 2023-12-29 (×7): qty 1

## 2023-12-29 NOTE — Progress Notes (Signed)
 Patient ID: Mia Bartlett, female   DOB: November 25, 1960, 63 y.o.   MRN: [id number]  Brief GI note  Please see GI note from yesterday. Surgery was consulted today per hospitalist due to concern for gallstone pancreatitis  Patient has history of chronic pancreatitis and current noncomplicated pancreatitis is felt to be an acute exacerbation of her chronic pancreatitis. She has had extensive imaging this admission which does not show any evidence of gallstones, or CBD stones.   She does have a chronic pain syndrome, and has had chronic methadone  use which has made pain control an issue.  Plan; continue supportive management, low-fat diet She can be discharged home from GI perspective when she is keeping down p.o.'s and has adequate pain management Patient's husband died earlier this week here at the hospital, patient is also facing eviction/housing instability which are complicating her situation.  GI will sign off, available if needed

## 2023-12-29 NOTE — Progress Notes (Signed)
 Patient sleeping and did not want to be disturbed for vital signs. Patient appears to be comfortable and sleeping at this time.  Will continue to assess for any needs/changes.

## 2023-12-29 NOTE — Progress Notes (Addendum)
 PROGRESS NOTE    Mia Bartlett  FMW:[id number] DOB: 05-31-1960 DOA: 12/25/2023 PCP: Patient, No Pcp Per   Brief Narrative: Mia Bartlett is a 63 y.o. female with a history of pancreatitis, chronic pain syndrome, fibromyalgia.  Patient presented secondary to abdominal pain with evidence of acute on chronic pancreatitis with concern for gallstone etiology. GI consulted. MRCP without evidence of biliary stone.   Assessment and Plan:  Acute gallstone pancreatitis Associated elevated alkaline phosphatase, AST and ALT. Initial CT scan significant for an 8 mm gallstone at the ampulla of vater with associated main pancreatic duct measuring 10 mm in diameter. GI consulted. MRCP revealed no evidence of gallstone. ERCP deferred. LFTs trending down. Patient still with ongoing abdominal pain with attempt to advance diet. -Consult General surgery -GI recommendations: Attempting to advance diet  Chronic pain syndrome Patient is on methadone  as an outpatient. -Continue methadone  200 mg daily  Constipation Likely related to chronic opiate use.   Shingles Postherpetic neuralgia Some erupted vesicles noted on right buttock. Appears mild. Gabapentin  started.  -Continue gabapentin  -Start Valtrex  x7 days -Airborne precautions per hospital protocol  Grief Patient recently lost her husband to death. Chaplain consulted.   DVT prophylaxis: SCDs Code Status:   Code Status: Full Code Family Communication: None at bedside Disposition Plan: Discharge pending ongoing specialist recommendations, ability to tolerate diet, improvement in abdominal pain   Consultants:  Gastroenterology General surgery  Procedures:  None  Antimicrobials: None    Subjective: Patient reports consistent epigastric pain but also has LLQ pain. Some nausea but no emesis.  Objective: BP (!) 144/91 (BP Location: Left Arm)   Pulse 97   Temp 98.4 F (36.9 C) (Oral)   Resp 18   Ht 5' 4 (1.626 m)   Wt 61.2 kg    SpO2 96%   BMI 23.16 kg/m   Examination:  General exam: Appears calm and comfortable Respiratory system: Clear to auscultation. Respiratory effort normal. Cardiovascular system: S1 & S2 heard, RRR. No murmurs. Gastrointestinal system: Abdomen is nondistended, soft and nontender. Normal bowel sounds heard. Central nervous system: Alert and oriented. No focal neurological deficits. Musculoskeletal: No edema. No calf tenderness Skin: Few scattered likely previously vesicular lesions noted without surrounding erythema Psychiatry: Judgement and insight appear normal. Mood & affect appropriate.    Data Reviewed: I have personally reviewed following labs and imaging studies  CBC Lab Results  Component Value Date   WBC 7.8 12/28/2023   RBC 4.03 12/28/2023   HGB 11.8 (L) 12/28/2023   HCT 36.9 12/28/2023   MCV 91.6 12/28/2023   MCH 29.3 12/28/2023   PLT 365 12/28/2023   MCHC 32.0 12/28/2023   RDW 12.8 12/28/2023   LYMPHSABS 1.8 10/11/2017   MONOABS 0.3 10/11/2017   EOSABS 0.0 10/11/2017   BASOSABS 0.0 10/11/2017     Last metabolic panel Lab Results  Component Value Date   NA 141 12/28/2023   K 4.1 12/28/2023   CL 99 12/28/2023   CO2 27 12/28/2023   BUN 6 (L) 12/28/2023   CREATININE 1.01 (H) 12/28/2023   GLUCOSE 94 12/28/2023   GFRNONAA >60 12/28/2023   GFRAA >60 10/10/2017   CALCIUM 9.0 12/28/2023   PROT 6.3 (L) 12/28/2023   ALBUMIN 3.0 (L) 12/28/2023   BILITOT 0.5 12/28/2023   ALKPHOS 230 (H) 12/28/2023   AST 27 12/28/2023   ALT 43 12/28/2023   ANIONGAP 15 12/28/2023    GFR: Estimated Creatinine Clearance: 50.5 mL/min (A) (by C-G formula based on SCr of 1.01  mg/dL (H)).  No results found for this or any previous visit (from the past 240 hours).    Radiology Studies: US  ABDOMEN LIMITED RUQ (LIVER/GB) Result Date: 12/28/2023 CLINICAL DATA:  Pancreatitis EXAM: ULTRASOUND ABDOMEN LIMITED RIGHT UPPER QUADRANT COMPARISON:  MR abdomen dated 12/27/2023 FINDINGS:  Gallbladder: No gallstones or wall thickening visualized. No sonographic Murphy sign noted by sonographer. Common bile duct: Diameter: 6 mm Liver: No focal lesion identified. Within normal limits in parenchymal echogenicity. Portal vein is patent on color Doppler imaging with normal direction of blood flow towards the liver. Other: None. IMPRESSION: Normal right upper quadrant ultrasound examination. Electronically Signed   By: Limin  Xu M.D.   On: 12/28/2023 13:24   MR ABDOMEN MRCP W WO CONTAST Result Date: 12/27/2023 CLINICAL DATA:  Pancreatitis EXAM: MRI ABDOMEN WITHOUT AND WITH CONTRAST (INCLUDING MRCP) TECHNIQUE: Multiplanar multisequence MR imaging of the abdomen was performed both before and after the administration of intravenous contrast. Heavily T2-weighted images of the biliary and pancreatic ducts were obtained, and three-dimensional MRCP images were rendered by post processing. CONTRAST:  6mL GADAVIST  GADOBUTROL  1 MMOL/ML IV SOLN COMPARISON:  CT scan 12/25/2023 FINDINGS: Despite efforts by the technologist and patient, motion artifact is present on today's exam and could not be eliminated. This reduces exam sensitivity and specificity. This is a common outcome when MRCP is attempted in the inpatient setting where patients are less likely to be able to breath hold and cooperate in controlling motion. Lower chest: Unremarkable Hepatobiliary: Biliary system and pancreatic duct blurred by motion artifact on the MRCP images most other sequences. No significant abnormal enhancing pancreatic parenchymal lesion is identified. Pancreas: Based on the prior CT examination there is a 6 mm calcification along the dorsal pancreatic duct in the vicinity of the ampulla with associated dilation of the dorsal pancreatic duct, this is less readily appreciable on today's MRCP. No well-defined focal pancreatic lesion identified. Spleen:  Unremarkable Adrenals/Urinary Tract:  Unremarkable Stomach/Bowel: Prominent stool  throughout the colon favors constipation. Vascular/Lymphatic:  Abdominal aortic atherosclerosis noted. Other:  No supplemental non-categorized findings. Musculoskeletal: Supraumbilical hernia contains adipose tissue and a small amount of fluid. Lower lumbar degenerative disc disease. IMPRESSION: 1. Despite efforts by the technologist and patient, motion artifact is present on today's exam and could not be eliminated. This reduces exam sensitivity and specificity. 2. Based on the prior CT examination there is a 6 mm calcification along the dorsal pancreatic duct in the vicinity of the ampulla with associated dilation of the dorsal pancreatic duct, this is less readily appreciable on today's MRCP images due to motion artifact. No well-defined focal pancreatic lesion is identified. No biliary dilatation observed 3. Prominent stool throughout the colon favors constipation. 4. Supraumbilical hernia contains adipose tissue and a small amount of fluid. 5. Lower lumbar degenerative disc disease. 6. Aortic Atherosclerosis (ICD10-I70.0). Electronically Signed   By: Ryan Salvage M.D.   On: 12/27/2023 12:50   MR 3D Recon At Scanner Result Date: 12/27/2023 CLINICAL DATA:  Pancreatitis EXAM: MRI ABDOMEN WITHOUT AND WITH CONTRAST (INCLUDING MRCP) TECHNIQUE: Multiplanar multisequence MR imaging of the abdomen was performed both before and after the administration of intravenous contrast. Heavily T2-weighted images of the biliary and pancreatic ducts were obtained, and three-dimensional MRCP images were rendered by post processing. CONTRAST:  6mL GADAVIST  GADOBUTROL  1 MMOL/ML IV SOLN COMPARISON:  CT scan 12/25/2023 FINDINGS: Despite efforts by the technologist and patient, motion artifact is present on today's exam and could not be eliminated. This reduces exam sensitivity and  specificity. This is a common outcome when MRCP is attempted in the inpatient setting where patients are less likely to be able to breath hold and  cooperate in controlling motion. Lower chest: Unremarkable Hepatobiliary: Biliary system and pancreatic duct blurred by motion artifact on the MRCP images most other sequences. No significant abnormal enhancing pancreatic parenchymal lesion is identified. Pancreas: Based on the prior CT examination there is a 6 mm calcification along the dorsal pancreatic duct in the vicinity of the ampulla with associated dilation of the dorsal pancreatic duct, this is less readily appreciable on today's MRCP. No well-defined focal pancreatic lesion identified. Spleen:  Unremarkable Adrenals/Urinary Tract:  Unremarkable Stomach/Bowel: Prominent stool throughout the colon favors constipation. Vascular/Lymphatic:  Abdominal aortic atherosclerosis noted. Other:  No supplemental non-categorized findings. Musculoskeletal: Supraumbilical hernia contains adipose tissue and a small amount of fluid. Lower lumbar degenerative disc disease. IMPRESSION: 1. Despite efforts by the technologist and patient, motion artifact is present on today's exam and could not be eliminated. This reduces exam sensitivity and specificity. 2. Based on the prior CT examination there is a 6 mm calcification along the dorsal pancreatic duct in the vicinity of the ampulla with associated dilation of the dorsal pancreatic duct, this is less readily appreciable on today's MRCP images due to motion artifact. No well-defined focal pancreatic lesion is identified. No biliary dilatation observed 3. Prominent stool throughout the colon favors constipation. 4. Supraumbilical hernia contains adipose tissue and a small amount of fluid. 5. Lower lumbar degenerative disc disease. 6. Aortic Atherosclerosis (ICD10-I70.0). Electronically Signed   By: Ryan Salvage M.D.   On: 12/27/2023 12:50      LOS: 4 days    Elgin Lam, MD [facility] 12/29/2023, 7:50 AM   If 7PM-7AM, please contact night-coverage [email or web address]

## 2023-12-29 NOTE — Plan of Care (Signed)

## 2023-12-29 NOTE — Hospital Course (Signed)
 Mia Bartlett is a 63 y.o. female with a history of pancreatitis, chronic pain syndrome, fibromyalgia.  Patient presented secondary to abdominal pain with evidence of acute on chronic pancreatitis with concern for gallstone etiology. GI consulted. MRCP without evidence of biliary stone.

## 2023-12-29 NOTE — Consult Note (Addendum)
 Consult Note  Mia Bartlett 11/14/1960  [id number].    Requesting MD: Elgin Lam, MD Chief Complaint/Reason for Consult: Pancreatitis HPI:  Patient is a 63 year old female admitted to the hospital on 12/25/23 with a 4 day history of RUQ pain. Patient with history of chronic pancreatitis of unknown etiology dating back before 2007 according to the patient. This episode of pain has felt similar to prior episodes of pancreatitis. She reported associated nausea and vomiting, reflux on admission but denied fever or chills. She had been visiting her husband in the hospital and actually went to the ED from his room secondary to severe pain. Her husband passed away on 2024/01/17. Patient has no imaging showing gallstones dating back to 2000 but etiology had not been determined for pancreatitis. PMH otherwise significant for Recent shingles, Chronic pain on methadone  and Chronic constipation. She has had prior cesarean section and abdominal hysterectomy. She is not on blood thinners. Allergy to doxycycline. Patient denies heavy alcohol use. Currently abdominal pain is reported as stable- no better, no worse, and more severe in the central and right upper abdomen. States the pain is worse when she gets upset. Says it is overall unchanged by PO intake. She has not vomiting since the day of admission. She reports constipation, with her last BM being multiple days ago and non-bloody. She expresses additional concern about housing instability, being evicted from her home.  ROS: Review of Systems  All other systems reviewed and are negative.  Negative other than HPI  Family History  Problem Relation Age of Onset   Diabetes Mother    Coronary artery disease Father    Cancer Daughter    Cancer Son     Past Medical History:  Diagnosis Date   Chronic fatigue    Fibromyalgia    Pancreatitis    Trigeminal neuralgia     Past Surgical History:  Procedure Laterality Date   ABDOMINAL HYSTERECTOMY      CESAREAN SECTION      Social History:  reports that she has never smoked. She does not have any smokeless tobacco history on file. She reports that she does not drink alcohol and does not use drugs.  Allergies:  Allergies  Allergen Reactions   Doxycycline Rash    Medications Prior to Admission  Medication Sig Dispense Refill   methadone  (DOLOPHINE ) 10 MG/ML solution Take 200 mg by mouth daily.       Blood pressure (!) 141/88, pulse 97, temperature 98.4 F (36.9 C), temperature source Oral, resp. rate 18, height 5' 4 (1.626 m), weight 61.2 kg, SpO2 96%. Physical Exam:  General: pleasant, WD,  female who is laying in bed in NAD HEENT: head is normocephalic, atraumatic.  Sclera are anicteric.  PERRL.  Ears and nose without any masses or lesions.  Mouth is pink and moist; poor dentition. Heart: regular, rate, and rhythm.  Normal s1,s2. No obvious murmurs, gallops, or rubs noted.  Palpable radial and pedal pulses bilaterally Lungs: CTAB, no wheezes, rhonchi, or rales noted.  Respiratory effort nonlabored Abd: soft, non distended, mild tenderness epigastric region and RUQ without  guarding, +BS, no masses, hernias, or organomegaly MS: all 4 extremities are symmetrical with no cyanosis, clubbing, or edema. Skin: warm and dry with no masses, lesions, or rashes Neuro: Cranial nerves 2-12 grossly intact, sensation is normal throughout Psych: A&Ox3 with an appropriate affect.   Results for orders placed or performed during the hospital encounter of 12/25/23 (from the past 48  hours)  Comprehensive metabolic panel     Status: Abnormal   Collection Time: 12/27/23  3:24 PM  Result Value Ref Range   Sodium 139 135 - 145 mmol/L   Potassium 3.7 3.5 - 5.1 mmol/L   Chloride 99 98 - 111 mmol/L   CO2 27 22 - 32 mmol/L   Glucose, Bld 128 (H) 70 - 99 mg/dL    Comment: Glucose reference range applies only to samples taken after fasting for at least 8 hours.   BUN 7 (L) 8 - 23 mg/dL   Creatinine,  Ser 9.00 0.44 - 1.00 mg/dL   Calcium 8.8 (L) 8.9 - 10.3 mg/dL   Total Protein 6.4 (L) 6.5 - 8.1 g/dL   Albumin 3.1 (L) 3.5 - 5.0 g/dL   AST 37 15 - 41 U/L   ALT 48 (H) 0 - 44 U/L   Alkaline Phosphatase 252 (H) 38 - 126 U/L   Total Bilirubin 0.5 0.0 - 1.2 mg/dL   GFR, Estimated >39 >39 mL/min    Comment: (NOTE) Calculated using the CKD-EPI Creatinine Equation (2021)    Anion gap 13 5 - 15    Comment: Performed at [facility], [address], [place], [place] [address]  Lipase, blood     Status: None   Collection Time: 12/27/23  3:24 PM  Result Value Ref Range   Lipase 42 11 - 51 U/L    Comment: Performed at [facility], [address] [place], [place] [address]  Comprehensive metabolic panel with GFR     Status: Abnormal   Collection Time: 12/28/23  6:02 AM  Result Value Ref Range   Sodium 141 135 - 145 mmol/L   Potassium 4.1 3.5 - 5.1 mmol/L   Chloride 99 98 - 111 mmol/L   CO2 27 22 - 32 mmol/L   Glucose, Bld 94 70 - 99 mg/dL    Comment: Glucose reference range applies only to samples taken after fasting for at least 8 hours.   BUN 6 (L) 8 - 23 mg/dL   Creatinine, Ser 8.98 (H) 0.44 - 1.00 mg/dL   Calcium 9.0 8.9 - 89.6 mg/dL   Total Protein 6.3 (L) 6.5 - 8.1 g/dL   Albumin 3.0 (L) 3.5 - 5.0 g/dL   AST 27 15 - 41 U/L   ALT 43 0 - 44 U/L   Alkaline Phosphatase 230 (H) 38 - 126 U/L   Total Bilirubin 0.5 0.0 - 1.2 mg/dL   GFR, Estimated >39 >39 mL/min    Comment: (NOTE) Calculated using the CKD-EPI Creatinine Equation (2021)    Anion gap 15 5 - 15    Comment: Performed at [facility], [address] [place], [place] [address]  CBC     Status: Abnormal   Collection Time: 12/28/23  6:02 AM  Result Value Ref Range   WBC 7.8 4.0 - 10.5 K/uL   RBC 4.03 3.87 - 5.11 MIL/uL   Hemoglobin 11.8 (L) 12.0 - 15.0 g/dL   HCT 63.0 63.9 - 53.9 %   MCV 91.6 80.0 - 100.0 fL   MCH 29.3 26.0 - 34.0 pg   MCHC 32.0 30.0 - 36.0 g/dL   RDW 87.1 88.4 - 84.4  %   Platelets 365 150 - 400 K/uL   nRBC 0.0 0.0 - 0.2 %    Comment: Performed at [facility], [address], [place], [place] [address]   US  ABDOMEN LIMITED RUQ (LIVER/GB) Result Date:  12/28/2023 CLINICAL DATA:  Pancreatitis EXAM: ULTRASOUND ABDOMEN LIMITED RIGHT UPPER QUADRANT COMPARISON:  MR abdomen dated 12/27/2023 FINDINGS: Gallbladder: No gallstones or wall thickening visualized. No sonographic Murphy sign noted by sonographer. Common bile duct: Diameter: 6 mm Liver: No focal lesion identified. Within normal limits in parenchymal echogenicity. Portal vein is patent on color Doppler imaging with normal direction of blood flow towards the liver. Other: None. IMPRESSION: Normal right upper quadrant ultrasound examination. Electronically Signed   By: Limin  Xu M.D.   On: 12/28/2023 13:24   MR ABDOMEN MRCP W WO CONTAST Result Date: 12/27/2023 CLINICAL DATA:  Pancreatitis EXAM: MRI ABDOMEN WITHOUT AND WITH CONTRAST (INCLUDING MRCP) TECHNIQUE: Multiplanar multisequence MR imaging of the abdomen was performed both before and after the administration of intravenous contrast. Heavily T2-weighted images of the biliary and pancreatic ducts were obtained, and three-dimensional MRCP images were rendered by post processing. CONTRAST:  6mL GADAVIST  GADOBUTROL  1 MMOL/ML IV SOLN COMPARISON:  CT scan 12/25/2023 FINDINGS: Despite efforts by the technologist and patient, motion artifact is present on today's exam and could not be eliminated. This reduces exam sensitivity and specificity. This is a common outcome when MRCP is attempted in the inpatient setting where patients are less likely to be able to breath hold and cooperate in controlling motion. Lower chest: Unremarkable Hepatobiliary: Biliary system and pancreatic duct blurred by motion artifact on the MRCP images most other sequences. No significant abnormal enhancing pancreatic parenchymal lesion is identified. Pancreas: Based on the prior CT examination there  is a 6 mm calcification along the dorsal pancreatic duct in the vicinity of the ampulla with associated dilation of the dorsal pancreatic duct, this is less readily appreciable on today's MRCP. No well-defined focal pancreatic lesion identified. Spleen:  Unremarkable Adrenals/Urinary Tract:  Unremarkable Stomach/Bowel: Prominent stool throughout the colon favors constipation. Vascular/Lymphatic:  Abdominal aortic atherosclerosis noted. Other:  No supplemental non-categorized findings. Musculoskeletal: Supraumbilical hernia contains adipose tissue and a small amount of fluid. Lower lumbar degenerative disc disease. IMPRESSION: 1. Despite efforts by the technologist and patient, motion artifact is present on today's exam and could not be eliminated. This reduces exam sensitivity and specificity. 2. Based on the prior CT examination there is a 6 mm calcification along the dorsal pancreatic duct in the vicinity of the ampulla with associated dilation of the dorsal pancreatic duct, this is less readily appreciable on today's MRCP images due to motion artifact. No well-defined focal pancreatic lesion is identified. No biliary dilatation observed 3. Prominent stool throughout the colon favors constipation. 4. Supraumbilical hernia contains adipose tissue and a small amount of fluid. 5. Lower lumbar degenerative disc disease. 6. Aortic Atherosclerosis (ICD10-I70.0). Electronically Signed   By: Ryan Salvage M.D.   On: 12/27/2023 12:50   MR 3D Recon At Scanner Result Date: 12/27/2023 CLINICAL DATA:  Pancreatitis EXAM: MRI ABDOMEN WITHOUT AND WITH CONTRAST (INCLUDING MRCP) TECHNIQUE: Multiplanar multisequence MR imaging of the abdomen was performed both before and after the administration of intravenous contrast. Heavily T2-weighted images of the biliary and pancreatic ducts were obtained, and three-dimensional MRCP images were rendered by post processing. CONTRAST:  6mL GADAVIST  GADOBUTROL  1 MMOL/ML IV SOLN  COMPARISON:  CT scan 12/25/2023 FINDINGS: Despite efforts by the technologist and patient, motion artifact is present on today's exam and could not be eliminated. This reduces exam sensitivity and specificity. This is a common outcome when MRCP is attempted in the inpatient setting where patients are less likely to be able to breath hold and cooperate in controlling  motion. Lower chest: Unremarkable Hepatobiliary: Biliary system and pancreatic duct blurred by motion artifact on the MRCP images most other sequences. No significant abnormal enhancing pancreatic parenchymal lesion is identified. Pancreas: Based on the prior CT examination there is a 6 mm calcification along the dorsal pancreatic duct in the vicinity of the ampulla with associated dilation of the dorsal pancreatic duct, this is less readily appreciable on today's MRCP. No well-defined focal pancreatic lesion identified. Spleen:  Unremarkable Adrenals/Urinary Tract:  Unremarkable Stomach/Bowel: Prominent stool throughout the colon favors constipation. Vascular/Lymphatic:  Abdominal aortic atherosclerosis noted. Other:  No supplemental non-categorized findings. Musculoskeletal: Supraumbilical hernia contains adipose tissue and a small amount of fluid. Lower lumbar degenerative disc disease. IMPRESSION: 1. Despite efforts by the technologist and patient, motion artifact is present on today's exam and could not be eliminated. This reduces exam sensitivity and specificity. 2. Based on the prior CT examination there is a 6 mm calcification along the dorsal pancreatic duct in the vicinity of the ampulla with associated dilation of the dorsal pancreatic duct, this is less readily appreciable on today's MRCP images due to motion artifact. No well-defined focal pancreatic lesion is identified. No biliary dilatation observed 3. Prominent stool throughout the colon favors constipation. 4. Supraumbilical hernia contains adipose tissue and a small amount of fluid. 5.  Lower lumbar degenerative disc disease. 6. Aortic Atherosclerosis (ICD10-I70.0). Electronically Signed   By: Ryan Salvage M.D.   On: 12-28-2023 12:50      Assessment/Plan Acute on chronic pancreatitis Pancreatic duct stone - CT 9/6 with acute on chronic pancreatitis secondary to 8 mm gallstone at ampulla of vater, main pancreatic duct 10 mm in diameter, no organized fluid collection, moderate colonic stool burden - MRCP 12-28-23 with motion artifact but 6 mm calcification along dorsal pancreatic duct in vicinity of ampulla with associated dilation of pancreatic duct, no well-defined focal pancreatic lesion, no biliary dilatation, prominent stool throughout the colon, fat containing supraumbilical hernia , lumbar DDD, aortic atherosclerosis - RUQ US  9/9 without gallstones, gallbladder wall thickening and normal diameter of CBD - lipase was 511 on admission, 42 on Dec 28, 2023; Alk Phos 328 on admit and trending down, AST/ALT 141/69 on admit and now normalized, Tbili has been WNL  - no leukocytosis, afebrile, and HD stable  - GI following as well and ERCP not recommended at this time - Reviewed with my attending, Dr. Vernetta, as well as with Hepatobiliary specialist Dr. Leonor Dawn, No role for surgery. Cholecystectomy will not help her pain, and this is less likely biliary pancreatitis as she has no gallstones. While it is possible that it this gallstone originated from the gallbladder and got lodged in the ampulla, it sees more likely that it formed in her pancreas. Looks like development of pancreatic duct stone from her chronic pancreatitis. If it is contributing to her pain/causing obstructive symptoms then could consider asking GI to reconsider endoscopic removal. MRCP limited by motion artifact but stone is clearly visible on CT. General surgery will sign off.   FEN: HH diet VTE: SCDs ID: PO valacyclovir   - per [facility] -  Recent shingles  Chronic pain on methadone  Chronic constipation  Grief -  husband passed away Dec 28, 2023  I reviewed Consultant GI notes, hospitalist notes, last 24 h vitals and pain scores, last 48 h intake and output, last 24 h labs and trends, and last 24 h imaging results.  This care required high  level of medical decision making.   [facility] 12/29/2023, 10:33  AM Please see Amion for pager number during day hours 7:00am-4:30pm

## 2023-12-29 NOTE — Plan of Care (Signed)

## 2023-12-29 NOTE — Progress Notes (Signed)
   12/29/23 1233  Spiritual Encounters  Type of Visit Initial  Care provided to: Patient  Conversation partners present during encounter Nurse  Referral source Nurse (RN/NT/LPN)  Reason for visit Urgent spiritual support  OnCall Visit No  Spiritual Framework  Presenting Themes Significant life change;Coping tools;Impactful experiences and emotions  Community/Connection Significant other  Needs/Challenges/Barriers Pt in need of counseling regarding the death of her husband on 12/29/2023  Patient Stress Factors Exhausted;Loss of control;Major life changes;Health changes;Family relationships;Financial concerns;Loss  Family Stress Factors None identified  Goals  Self/Personal Goals Pt wants counseling  Additional Comment(s) Counseling would be great for Pt learning how to cope with grief, loss of love one  Interventions  Spiritual Care Interventions Made Compassionate presence;Established relationship of care and support;Prayer;Provided grief education;Normalization of emotions  Intervention Outcomes  Outcomes Awareness of support;Connection to spiritual care   Chaplain visited Pt who is grieving the recent lost of her husband. Pt shared openly about her family dynamics and strained relationship with her stepchildren, noting she feels isolated since her husband, who she describes as the family's primary provider passed away.  Pt express guilt over not being physically able to care for her spouse during his hospitalization and wept during the visit. Chaplain provided presence, reflective listening, emotional and spiritual support, validating the Pt's grief and affirming the significant of her relationship with her husband.  Chaplain made a suggest would Pt be interested in counseling or grief classes , pt respond yes would really like that support. Pt was encouraged to  allow herself space to rest, and she expressed a desire to lay down as she became emotional and physically tired. C  Chaplin  offered  a word of prayer, per Pt's request and reminded the PT that chaplain services remain available upon request.

## 2023-12-30 DIAGNOSIS — K851 Biliary acute pancreatitis without necrosis or infection: Secondary | ICD-10-CM | POA: Diagnosis not present

## 2023-12-30 NOTE — Plan of Care (Signed)
  Problem: Clinical Measurements: Goal: Ability to maintain clinical measurements within normal limits will improve Outcome: Progressing Goal: Will remain free from infection Outcome: Progressing   Problem: Activity: Goal: Risk for activity intolerance will decrease Outcome: Progressing   Problem: Coping: Goal: Level of anxiety will decrease Outcome: Progressing   Problem: Pain Managment: Goal: General experience of comfort will improve and/or be controlled Outcome: Not Progressing   Problem: Safety: Goal: Ability to remain free from injury will improve Outcome: Progressing   Problem: Skin Integrity: Goal: Risk for impaired skin integrity will decrease Outcome: Progressing

## 2023-12-30 NOTE — Plan of Care (Signed)

## 2023-12-30 NOTE — Progress Notes (Signed)
 PROGRESS NOTE    Retina Bernardy  FMW:[id number] DOB: [birth year] DOA: 12/25/2023 PCP: Patient, No Pcp Per   Brief Narrative: Mia Bartlett is a 63 y.o. female with a history of pancreatitis, chronic pain syndrome, fibromyalgia.  Patient presented secondary to abdominal pain with evidence of acute on chronic pancreatitis with concern for gallstone etiology. GI consulted. MRCP without evidence of biliary stone.   Assessment and Plan:  Acute on chronic pancreatitis Associated elevated alkaline phosphatase, AST and ALT. Initial CT scan significant for an 8 mm gallstone at the ampulla of vater with associated main pancreatic duct measuring 10 mm in diameter. GI consulted. MRCP revealed no evidence of gallstone. ERCP deferred as patient unlikely has gallbladder pancreatitis, but rather acute on chronic pancreatitis, per GI. LFTs trending down. General surgery recommendation for no intervention. Patient still with ongoing abdominal pain with attempt to advance diet. -Continue diet -Analgesics as needed  Chronic pain syndrome Patient is on methadone  as an outpatient. -Continue methadone  200 mg daily  Constipation Likely related to chronic opiate use.   Shingles Postherpetic neuralgia Some erupted vesicles noted on right buttock. Appears mild. Gabapentin  started.  -Continue gabapentin  -Continue Valtrex  x7 days -Airborne precautions per hospital protocol  Grief Patient recently lost her husband to death. Chaplain consulted.   DVT prophylaxis: SCDs Code Status:   Code Status: Full Code Family Communication: None at bedside Disposition Plan: Discharge home likely in 24 hours pending improved diet tolerance.   Consultants:  Gastroenterology General surgery  Procedures:  None  Antimicrobials: None    Subjective: Patient with ongoing pain that worsened this morning after eating her breakfast. Also with some nausea this morning.  Objective: BP (!) 142/85 (BP Location: Left  Arm)   Pulse 95   Temp 98.6 F (37 C) (Oral)   Resp 18   Ht 5' 4 (1.626 m)   Wt 61.2 kg   SpO2 96%   BMI 23.16 kg/m   Examination:  General exam: Appears calm and comfortable Respiratory system: Clear to auscultation. Respiratory effort normal. Cardiovascular system: S1 & S2 heard, RRR. No murmurs. Gastrointestinal system: Abdomen is nondistended, soft and tender. Normal bowel sounds heard. Central nervous system: Alert and oriented. No focal neurological deficits. Musculoskeletal: No edema. No calf tenderness Psychiatry: Judgement and insight appear normal. Mood & affect appropriate.    Data Reviewed: I have personally reviewed following labs and imaging studies  CBC Lab Results  Component Value Date   WBC 7.8 12/28/2023   RBC 4.03 12/28/2023   HGB 11.8 (L) 12/28/2023   HCT 36.9 12/28/2023   MCV 91.6 12/28/2023   MCH 29.3 12/28/2023   PLT 365 12/28/2023   MCHC 32.0 12/28/2023   RDW 12.8 12/28/2023   LYMPHSABS 1.8 10/11/2017   MONOABS 0.3 10/11/2017   EOSABS 0.0 10/11/2017   BASOSABS 0.0 10/11/2017     Last metabolic panel Lab Results  Component Value Date   NA 141 12/28/2023   K 4.1 12/28/2023   CL 99 12/28/2023   CO2 27 12/28/2023   BUN 6 (L) 12/28/2023   CREATININE 1.01 (H) 12/28/2023   GLUCOSE 94 12/28/2023   GFRNONAA >60 12/28/2023   GFRAA >60 10/10/2017   CALCIUM 9.0 12/28/2023   PROT 6.3 (L) 12/28/2023   ALBUMIN 3.0 (L) 12/28/2023   BILITOT 0.5 12/28/2023   ALKPHOS 230 (H) 12/28/2023   AST 27 12/28/2023   ALT 43 12/28/2023   ANIONGAP 15 12/28/2023    GFR: Estimated Creatinine Clearance: 50.5 mL/min (A) (by C-G  formula based on SCr of 1.01 mg/dL (H)).  No results found for this or any previous visit (from the past 240 hours).    Radiology Studies: US  ABDOMEN LIMITED RUQ (LIVER/GB) Result Date: 12/28/2023 CLINICAL DATA:  Pancreatitis EXAM: ULTRASOUND ABDOMEN LIMITED RIGHT UPPER QUADRANT COMPARISON:  MR abdomen dated 12/27/2023 FINDINGS:  Gallbladder: No gallstones or wall thickening visualized. No sonographic Murphy sign noted by sonographer. Common bile duct: Diameter: 6 mm Liver: No focal lesion identified. Within normal limits in parenchymal echogenicity. Portal vein is patent on color Doppler imaging with normal direction of blood flow towards the liver. Other: None. IMPRESSION: Normal right upper quadrant ultrasound examination. Electronically Signed   By: Limin  Xu M.D.   On: 12/28/2023 13:24      LOS: 5 days    Elgin Lam, MD [facility] 12/30/2023, 8:46 AM   If 7PM-7AM, please contact night-coverage [email or web address]

## 2023-12-31 DIAGNOSIS — K861 Other chronic pancreatitis: Secondary | ICD-10-CM | POA: Diagnosis not present

## 2023-12-31 DIAGNOSIS — K859 Acute pancreatitis without necrosis or infection, unspecified: Secondary | ICD-10-CM | POA: Diagnosis not present

## 2023-12-31 LAB — CBC
HCT: 35 % — ABNORMAL LOW (ref 36.0–46.0)
Hemoglobin: 11.2 g/dL — ABNORMAL LOW (ref 12.0–15.0)
MCH: 29.6 pg (ref 26.0–34.0)
MCHC: 32 g/dL (ref 30.0–36.0)
MCV: 92.3 fL (ref 80.0–100.0)
Platelets: 355 K/uL (ref 150–400)
RBC: 3.79 MIL/uL — ABNORMAL LOW (ref 3.87–5.11)
RDW: 12.3 % (ref 11.5–15.5)
WBC: 8.5 K/uL (ref 4.0–10.5)
nRBC: 0 % (ref 0.0–0.2)

## 2023-12-31 LAB — COMPREHENSIVE METABOLIC PANEL WITH GFR
ALT: 26 U/L (ref 0–44)
AST: 19 U/L (ref 15–41)
Albumin: 3.4 g/dL — ABNORMAL LOW (ref 3.5–5.0)
Alkaline Phosphatase: 182 U/L — ABNORMAL HIGH (ref 38–126)
Anion gap: 11 (ref 5–15)
BUN: 17 mg/dL (ref 8–23)
CO2: 27 mmol/L (ref 22–32)
Calcium: 9.2 mg/dL (ref 8.9–10.3)
Chloride: 98 mmol/L (ref 98–111)
Creatinine, Ser: 1.21 mg/dL — ABNORMAL HIGH (ref 0.44–1.00)
GFR, Estimated: 51 mL/min — ABNORMAL LOW (ref >60)
Glucose, Bld: 108 mg/dL — ABNORMAL HIGH (ref 70–99)
Potassium: 4.8 mmol/L (ref 3.5–5.1)
Sodium: 136 mmol/L (ref 135–145)
Total Bilirubin: 0.2 mg/dL (ref 0.0–1.2)
Total Protein: 6.8 g/dL (ref 6.5–8.1)

## 2023-12-31 NOTE — Plan of Care (Signed)
   Problem: Nutrition: Goal: Adequate nutrition will be maintained Outcome: Progressing

## 2023-12-31 NOTE — Progress Notes (Signed)
 Patient had no complaints of nausea, no vomiting. Ate several snacks during the night shift and tolerated well.

## 2023-12-31 NOTE — Plan of Care (Signed)
  Problem: Skin Integrity: Goal: Risk for impaired skin integrity will decrease Outcome: Progressing   Problem: Coping: Goal: Level of anxiety will decrease Outcome: Not Progressing   Problem: Pain Managment: Goal: General experience of comfort will improve and/or be controlled Outcome: Not Progressing   Problem: Activity: Goal: Risk for activity intolerance will decrease Outcome: Completed/Met   Problem: Safety: Goal: Ability to remain free from injury will improve Outcome: Completed/Met

## 2023-12-31 NOTE — Progress Notes (Signed)
 PROGRESS NOTE    Mia Bartlett  FMW:[id number] DOB: 1961/04/12 DOA: 12/25/2023 PCP: Bennett Reuben POUR, MD   Brief Narrative: Mia Bartlett is a 63 y.o. female with a history of pancreatitis, chronic pain syndrome, fibromyalgia.  Patient presented secondary to abdominal pain with evidence of acute on chronic pancreatitis with concern for gallstone etiology. GI consulted. MRCP without evidence of biliary stone.   Assessment and Plan:  Acute on chronic pancreatitis Associated elevated alkaline phosphatase, AST and ALT. Initial CT scan significant for an 8 mm gallstone at the ampulla of vater with associated main pancreatic duct measuring 10 mm in diameter. GI consulted. MRCP revealed no evidence of gallstone. ERCP deferred as patient unlikely has gallbladder pancreatitis, but rather acute on chronic pancreatitis, per GI. LFTs trending down. General surgery recommendation for no intervention. Patient still with ongoing abdominal pain with attempt to advance diet. -Continue diet -Analgesics as needed -If continued pain today into tomorrow, will deescalate to NPO and consider NG tube  Chronic pain syndrome Patient is on methadone  as an outpatient. -Continue methadone  200 mg daily  Constipation Likely related to chronic opiate use.   Shingles Postherpetic neuralgia Some erupted vesicles noted on right buttock. Appears mild. Gabapentin  started.  -Continue gabapentin  -Continue Valtrex  x7 days -Airborne/contact precautions per hospital protocol  Grief Patient recently lost her husband to death. Chaplain consulted.   DVT prophylaxis: SCDs Code Status:   Code Status: Full Code Family Communication: None at bedside Disposition Plan: Discharge home likely in 24 hours pending improved diet tolerance.   Consultants:  Gastroenterology General surgery  Procedures:  None  Antimicrobials: None    Subjective: Patient reports improved pain last night, however pain worsened again  this morning with food. Some nausea.  Objective: BP (!) 150/94 (BP Location: Left Arm)   Pulse 99   Temp 98.4 F (36.9 C) (Oral)   Resp 17   Ht 5' 4 (1.626 m)   Wt 61.2 kg   SpO2 98%   BMI 23.16 kg/m   Examination:  General exam: Appears calm and comfortable Respiratory system: Respiratory effort normal. Gastrointestinal system: Abdomen is nondistended, soft and tender in epigastric area. Central nervous system: Alert and oriented. Psychiatry: Judgement and insight appear normal. Mood & affect appropriate.    Data Reviewed: I have personally reviewed following labs and imaging studies  CBC Lab Results  Component Value Date   WBC 7.8 12/28/2023   RBC 4.03 12/28/2023   HGB 11.8 (L) 12/28/2023   HCT 36.9 12/28/2023   MCV 91.6 12/28/2023   MCH 29.3 12/28/2023   PLT 365 12/28/2023   MCHC 32.0 12/28/2023   RDW 12.8 12/28/2023   LYMPHSABS 1.8 10/11/2017   MONOABS 0.3 10/11/2017   EOSABS 0.0 10/11/2017   BASOSABS 0.0 10/11/2017     Last metabolic panel Lab Results  Component Value Date   NA 141 12/28/2023   K 4.1 12/28/2023   CL 99 12/28/2023   CO2 27 12/28/2023   BUN 6 (L) 12/28/2023   CREATININE 1.01 (H) 12/28/2023   GLUCOSE 94 12/28/2023   GFRNONAA >60 12/28/2023   GFRAA >60 10/10/2017   CALCIUM 9.0 12/28/2023   PROT 6.3 (L) 12/28/2023   ALBUMIN 3.0 (L) 12/28/2023   BILITOT 0.5 12/28/2023   ALKPHOS 230 (H) 12/28/2023   AST 27 12/28/2023   ALT 43 12/28/2023   ANIONGAP 15 12/28/2023    GFR: Estimated Creatinine Clearance: 50.5 mL/min (A) (by C-G formula based on SCr of 1.01 mg/dL (H)).  No results  found for this or any previous visit (from the past 240 hours).    Radiology Studies: No results found.     LOS: 6 days    Elgin Lam, MD [facility] 12/31/2023, 12:53 PM   If 7PM-7AM, please contact night-coverage [email or web address]

## 2024-01-01 ENCOUNTER — Other Ambulatory Visit (HOSPITAL_COMMUNITY): Payer: Self-pay

## 2024-01-01 DIAGNOSIS — K861 Other chronic pancreatitis: Secondary | ICD-10-CM | POA: Diagnosis not present

## 2024-01-01 DIAGNOSIS — B029 Zoster without complications: Secondary | ICD-10-CM | POA: Insufficient documentation

## 2024-01-01 DIAGNOSIS — K859 Acute pancreatitis without necrosis or infection, unspecified: Secondary | ICD-10-CM | POA: Diagnosis not present

## 2024-01-01 LAB — GLUCOSE, CAPILLARY: Glucose-Capillary: 102 mg/dL — ABNORMAL HIGH (ref 70–99)

## 2024-01-01 MED ORDER — LIDOCAINE 5 % EX PTCH
1.0000 | MEDICATED_PATCH | CUTANEOUS | 0 refills | Status: AC
  Filled 2024-01-01: qty 3, 3d supply, fill #0

## 2024-01-01 MED ORDER — GABAPENTIN 100 MG PO CAPS
100.0000 mg | ORAL_CAPSULE | Freq: Three times a day (TID) | ORAL | 0 refills | Status: AC
  Filled 2024-01-01: qty 90, 30d supply, fill #0

## 2024-01-01 MED ORDER — OXYCODONE HCL 5 MG PO TABS
5.0000 mg | ORAL_TABLET | Freq: Four times a day (QID) | ORAL | 0 refills | Status: AC | PRN
  Filled 2024-01-01: qty 12, 3d supply, fill #0

## 2024-01-01 MED ORDER — VALACYCLOVIR HCL 1 G PO TABS
1000.0000 mg | ORAL_TABLET | Freq: Two times a day (BID) | ORAL | 0 refills | Status: AC
  Filled 2024-01-01: qty 8, 4d supply, fill #0

## 2024-01-01 NOTE — Progress Notes (Signed)
 During this shift patient complained of headache (trigeminal neuralgia). Was decreased with prn dilaudid . Ate several times (snacks) during the night with no c/o abd pain, nausea or vomiting. Did request prns for sleep and anxiety.  Is concerned about her living situation, at this time, and still very emotional over the recent passing of her spouse.

## 2024-01-01 NOTE — Plan of Care (Signed)
   Problem: Education: Goal: Knowledge of General Education information will improve Description: Including pain rating scale, medication(s)/side effects and non-pharmacologic comfort measures Outcome: Progressing   Problem: Health Behavior/Discharge Planning: Goal: Ability to manage health-related needs will improve Outcome: Progressing   Problem: Nutrition: Goal: Adequate nutrition will be maintained Outcome: Progressing

## 2024-01-01 NOTE — Discharge Instructions (Addendum)
 Mia Bartlett,  You were in the hospital with pancreatitis, which appears to be related to your chronic pancreatitis. The GI physician has recommended continued pain management and a low fat diet. While you were here, you developed shingles. Please continue medications as prescribed. Please stay very well hydrated and follow-up with your PCP for repeat labs.

## 2024-01-01 NOTE — Plan of Care (Signed)
  Problem: Education: Goal: Knowledge of General Education information will improve Description: Including pain rating scale, medication(s)/side effects and non-pharmacologic comfort measures Outcome: Adequate for Discharge   Problem: Health Behavior/Discharge Planning: Goal: Ability to manage health-related needs will improve Outcome: Adequate for Discharge   Problem: Clinical Measurements: Goal: Ability to maintain clinical measurements within normal limits will improve Outcome: Adequate for Discharge Goal: Will remain free from infection Outcome: Adequate for Discharge Goal: Diagnostic test results will improve Outcome: Adequate for Discharge Goal: Respiratory complications will improve Outcome: Adequate for Discharge Goal: Cardiovascular complication will be avoided Outcome: Adequate for Discharge   Problem: Nutrition: Goal: Adequate nutrition will be maintained Outcome: Adequate for Discharge   Problem: Coping: Goal: Level of anxiety will decrease Outcome: Adequate for Discharge   Problem: Elimination: Goal: Will not experience complications related to bowel motility Outcome: Adequate for Discharge Goal: Will not experience complications related to urinary retention Outcome: Adequate for Discharge   Problem: Pain Managment: Goal: General experience of comfort will improve and/or be controlled Outcome: Adequate for Discharge   Problem: Skin Integrity: Goal: Risk for impaired skin integrity will decrease Outcome: Adequate for Discharge

## 2024-01-01 NOTE — Progress Notes (Signed)
 Donny Maxin to be D/C'd  per MD order.  Discussed with the patient and all questions fully answered.  VSS, Skin clean, dry and intact without evidence of skin break down, no evidence of skin tears noted.  IV catheter discontinued intact. Site without signs and symptoms of complications. Dressing and pressure applied.  An After Visit Summary was printed and given to the patient. Patient received prescriptions from [facility].  D/c education completed with patient/family including follow up instructions, medication list, d/c activities limitations if indicated, with other d/c instructions as indicated by MD - patient able to verbalize understanding, all questions fully answered.   Patient instructed to return to ED, call 911, or call MD for any changes in condition.   Patient to be escorted to main entrance to retrieve private vehicle to D/C home.

## 2024-01-01 NOTE — Discharge Summary (Signed)
 Physician Discharge Summary   Patient: Mia Bartlett MRN: [id number] DOB: 08-20-1960  Admit date:     12/25/2023  Discharge date: 01/01/24  Discharge Physician: Elgin Lam, MD   PCP: Bennett Reuben POUR, MD   Recommendations at discharge:  PCP visit for hospital follow-up GI visit for hospital follow-up  Discharge Diagnoses: Principal Problem:   Acute on chronic pancreatitis [facility]) Active Problems:   Abdominal pain, chronic, right upper quadrant   Shingles   Hospital Course: Mia Bartlett is a 63 y.o. female with a history of pancreatitis, chronic pain syndrome, fibromyalgia.  Patient presented secondary to abdominal pain with evidence of acute on chronic pancreatitis with concern for gallstone etiology. GI consulted. MRCP without evidence of biliary stone. Diet advanced.  Assessment and Plan:  Acute on chronic pancreatitis Associated elevated alkaline phosphatase, AST and ALT. Initial CT scan significant for an 8 mm gallstone at the ampulla of vater with associated main pancreatic duct measuring 10 mm in diameter. GI consulted. MRCP revealed no evidence of gallstone. ERCP deferred as patient unlikely has gallbladder pancreatitis, but rather acute on chronic pancreatitis, per GI. LFTs trending down. General surgery recommendation for no intervention. Patient with continued pain but able to tolerate oral diet consistently. Patient to follow-up with GI as an outpatient.   Chronic pain syndrome Patient is on methadone  as an outpatient. Continue methadone  200 mg daily.   Constipation Likely related to chronic opiate use.    Shingles Postherpetic neuralgia Some erupted vesicles noted on right buttock. Appears mild. Gabapentin  and Valtrex  started. Continue on discharge.   Grief Patient recently lost her husband to death. Chaplain consulted.  Consultants:  Gastroenterology General surgery  Procedures performed: None   Disposition: Home Diet recommendation: Low fat  diet   DISCHARGE MEDICATION: Allergies as of 01/01/2024       Reactions   Doxycycline Rash        Medication List     TAKE these medications    gabapentin  100 MG capsule Commonly known as: NEURONTIN  Take 1 capsule (100 mg total) by mouth 3 (three) times daily.   lidocaine  5 % Commonly known as: Lidoderm  Place 1 patch onto the skin daily for 3 days. Remove & Discard patch within 12 hours. Do not apply to areas of broken skin.   methadone  10 MG/ML solution Commonly known as: DOLOPHINE  Take 200 mg by mouth daily.   oxyCODONE  5 MG immediate release tablet Commonly known as: Roxicodone  Take 1 tablet (5 mg total) by mouth every 6 (six) hours as needed for up to 5 days.   valACYclovir  1000 MG tablet Commonly known as: VALTREX  Take 1 tablet (1,000 mg total) by mouth 2 (two) times daily for 4 days.        Follow-up Information     Bennett Reuben POUR, MD Follow up.   Specialty: Family Medicine Why: TIME : 12 NOON DATE : OCTOBER 01,2025 [facility]  PLEASE BRING ALL CURRENT MEDICATION, ID and INS CARD Contact information: [address] [place] [address] [phone]         Shila Gustav GAILS, MD. Schedule an appointment as soon as possible for a visit.   Specialty: Gastroenterology Why: For hospital follow-up Contact information: [address] [place] [address] [phone]                Discharge Exam: BP (!) 154/87 (BP Location: Right Arm)   Pulse 91   Temp 98.1 F (36.7 C) (Oral)   Resp 16  Ht 5' 4 (1.626 m)   Wt 61.2 kg   SpO2 98%   BMI 23.16 kg/m   General exam: Appears calm and comfortable Respiratory system: Respiratory effort normal. Gastrointestinal system: Abdomen is nondistended, soft and tender.  Central nervous system: Alert and oriented. No focal neurological deficits. Psychiatry: Judgement and insight appear normal. Mood & affect appropriate.   Condition at discharge: stable  The results of significant  diagnostics from this hospitalization (including imaging, microbiology, ancillary and laboratory) are listed below for reference.   Imaging Studies: US  ABDOMEN LIMITED RUQ (LIVER/GB) Result Date: 12/28/2023 CLINICAL DATA:  Pancreatitis EXAM: ULTRASOUND ABDOMEN LIMITED RIGHT UPPER QUADRANT COMPARISON:  MR abdomen dated 12/27/2023 FINDINGS: Gallbladder: No gallstones or wall thickening visualized. No sonographic Murphy sign noted by sonographer. Common bile duct: Diameter: 6 mm Liver: No focal lesion identified. Within normal limits in parenchymal echogenicity. Portal vein is patent on color Doppler imaging with normal direction of blood flow towards the liver. Other: None. IMPRESSION: Normal right upper quadrant ultrasound examination. Electronically Signed   By: Limin  Xu M.D.   On: 12/28/2023 13:24   MR ABDOMEN MRCP W WO CONTAST Result Date: 12/27/2023 CLINICAL DATA:  Pancreatitis EXAM: MRI ABDOMEN WITHOUT AND WITH CONTRAST (INCLUDING MRCP) TECHNIQUE: Multiplanar multisequence MR imaging of the abdomen was performed both before and after the administration of intravenous contrast. Heavily T2-weighted images of the biliary and pancreatic ducts were obtained, and three-dimensional MRCP images were rendered by post processing. CONTRAST:  6mL GADAVIST  GADOBUTROL  1 MMOL/ML IV SOLN COMPARISON:  CT scan 12/25/2023 FINDINGS: Despite efforts by the technologist and patient, motion artifact is present on today's exam and could not be eliminated. This reduces exam sensitivity and specificity. This is a common outcome when MRCP is attempted in the inpatient setting where patients are less likely to be able to breath hold and cooperate in controlling motion. Lower chest: Unremarkable Hepatobiliary: Biliary system and pancreatic duct blurred by motion artifact on the MRCP images most other sequences. No significant abnormal enhancing pancreatic parenchymal lesion is identified. Pancreas: Based on the prior CT examination  there is a 6 mm calcification along the dorsal pancreatic duct in the vicinity of the ampulla with associated dilation of the dorsal pancreatic duct, this is less readily appreciable on today's MRCP. No well-defined focal pancreatic lesion identified. Spleen:  Unremarkable Adrenals/Urinary Tract:  Unremarkable Stomach/Bowel: Prominent stool throughout the colon favors constipation. Vascular/Lymphatic:  Abdominal aortic atherosclerosis noted. Other:  No supplemental non-categorized findings. Musculoskeletal: Supraumbilical hernia contains adipose tissue and a small amount of fluid. Lower lumbar degenerative disc disease. IMPRESSION: 1. Despite efforts by the technologist and patient, motion artifact is present on today's exam and could not be eliminated. This reduces exam sensitivity and specificity. 2. Based on the prior CT examination there is a 6 mm calcification along the dorsal pancreatic duct in the vicinity of the ampulla with associated dilation of the dorsal pancreatic duct, this is less readily appreciable on today's MRCP images due to motion artifact. No well-defined focal pancreatic lesion is identified. No biliary dilatation observed 3. Prominent stool throughout the colon favors constipation. 4. Supraumbilical hernia contains adipose tissue and a small amount of fluid. 5. Lower lumbar degenerative disc disease. 6. Aortic Atherosclerosis (ICD10-I70.0). Electronically Signed   By: Ryan Salvage M.D.   On: 12/27/2023 12:50   MR 3D Recon At Scanner Result Date: 12/27/2023 CLINICAL DATA:  Pancreatitis EXAM: MRI ABDOMEN WITHOUT AND WITH CONTRAST (INCLUDING MRCP) TECHNIQUE: Multiplanar multisequence MR imaging of the abdomen was  performed both before and after the administration of intravenous contrast. Heavily T2-weighted images of the biliary and pancreatic ducts were obtained, and three-dimensional MRCP images were rendered by post processing. CONTRAST:  6mL GADAVIST  GADOBUTROL  1 MMOL/ML IV SOLN  COMPARISON:  CT scan 12/25/2023 FINDINGS: Despite efforts by the technologist and patient, motion artifact is present on today's exam and could not be eliminated. This reduces exam sensitivity and specificity. This is a common outcome when MRCP is attempted in the inpatient setting where patients are less likely to be able to breath hold and cooperate in controlling motion. Lower chest: Unremarkable Hepatobiliary: Biliary system and pancreatic duct blurred by motion artifact on the MRCP images most other sequences. No significant abnormal enhancing pancreatic parenchymal lesion is identified. Pancreas: Based on the prior CT examination there is a 6 mm calcification along the dorsal pancreatic duct in the vicinity of the ampulla with associated dilation of the dorsal pancreatic duct, this is less readily appreciable on today's MRCP. No well-defined focal pancreatic lesion identified. Spleen:  Unremarkable Adrenals/Urinary Tract:  Unremarkable Stomach/Bowel: Prominent stool throughout the colon favors constipation. Vascular/Lymphatic:  Abdominal aortic atherosclerosis noted. Other:  No supplemental non-categorized findings. Musculoskeletal: Supraumbilical hernia contains adipose tissue and a small amount of fluid. Lower lumbar degenerative disc disease. IMPRESSION: 1. Despite efforts by the technologist and patient, motion artifact is present on today's exam and could not be eliminated. This reduces exam sensitivity and specificity. 2. Based on the prior CT examination there is a 6 mm calcification along the dorsal pancreatic duct in the vicinity of the ampulla with associated dilation of the dorsal pancreatic duct, this is less readily appreciable on today's MRCP images due to motion artifact. No well-defined focal pancreatic lesion is identified. No biliary dilatation observed 3. Prominent stool throughout the colon favors constipation. 4. Supraumbilical hernia contains adipose tissue and a small amount of fluid. 5.  Lower lumbar degenerative disc disease. 6. Aortic Atherosclerosis (ICD10-I70.0). Electronically Signed   By: Ryan Salvage M.D.   On: 12/27/2023 12:50   CT ABDOMEN PELVIS W CONTRAST Result Date: 12/25/2023 EXAM: CT ABDOMEN AND PELVIS WITH CONTRAST 12/25/2023 10:14:49 PM TECHNIQUE: CT of the abdomen and pelvis was performed with the administration of intravenous contrast. Multiplanar reformatted images are provided for review. Automated exposure control, iterative reconstruction, and/or weight-based adjustment of the mA/kV was utilized to reduce the radiation dose to as low as reasonably achievable. COMPARISON: 12/17/2005 CLINICAL HISTORY: Cholelithiasis; RLQ abdominal pain. FINDINGS: LOWER CHEST: No acute abnormality. LIVER: Mild intrahepatic biliary dilation. GALLBLADDER AND BILE DUCTS: There is an 8 mm gallstone at the ampulla of vater. No extrahepatic biliary dilation. The common bile duct measures 7 mm. Mild gallbladder distention. SPLEEN: No acute abnormality. PANCREAS: Atrophic pancreas with parenchymal calcification compatible with sequelae of chronic pancreatitis. Dilated main pancreatic duct measuring 10 mm. Mild fluid with stranding about the head of the pancreas . ADRENAL GLANDS: No acute abnormality. KIDNEYS, URETERS AND BLADDER: No stones in the kidneys or ureters. No hydronephrosis. No perinephric or periureteral stranding. Urinary bladder is unremarkable. GI AND BOWEL: Moderate colonic stool burden greatest in the right colon. No bowel wall thickening. No evidence of obstruction. Normal appendix. There is mild wall thickening of the descending duodenum. The findings may be related to duodenitis or, given the gallstone at the ampulla of vater, gallstone pancreatitis. PERITONEUM AND RETROPERITONEUM: Trace fluid and stranding in the right upper quadrant adjacent to the descending portion of the duodenum. No Free intraperitoneal air. VASCULATURE: Aorta is normal in  caliber. LYMPH NODES: No  lymphadenopathy. REPRODUCTIVE ORGANS: Hysterectomy. BONES AND SOFT TISSUES: No acute osseous abnormality. Fat containing periumbilical hernia. IMPRESSION: 1. Findings suggest acute on chronic pancreatitis secondary to an 8 mm gallstone at the ampulla of vater. The main pancreatic duct measures 10 mm in diameter. No organized fluid collection. 2. Moderate colonic stool burden greatest in the right colon. Electronically signed by: Norman Gatlin MD 12/25/2023 10:29 PM EDT RP Workstation: [id number]   DG Chest Portable 1 View Result Date: 12/25/2023 CLINICAL DATA:  Right upper quadrant pain, chest pain EXAM: PORTABLE CHEST 1 VIEW COMPARISON:  09/13/2004 FINDINGS: Single frontal view of the chest emanates an unremarkable cardiac silhouette. No airspace disease, effusion, or pneumothorax. No acute bony abnormalities. IMPRESSION: 1. No acute intrathoracic process. Electronically Signed   By: Ozell Daring M.D.   On: 12/25/2023 21:57    Microbiology: Results for orders placed or performed in visit on 02/21/19  Novel Coronavirus, NAA ([facility])     Status: None   Collection Time: 02/21/19  3:02 PM   Specimen: Nasopharyngeal(NP) swabs in vial transport medium   NASOPHARYNGE  TESTING  Result Value Ref Range Status   SARS-CoV-2, NAA Not Detected Not Detected Final    Comment: Testing was performed using the cobas(R) SARS-CoV-2 test. This nucleic acid amplification test was developed and its performance characteristics determined by [employer]. Nucleic acid amplification tests include PCR and TMA. This test has not been FDA cleared or approved. This test has been authorized by FDA under an Emergency Use Authorization (EUA). This test is only authorized for the duration of time the declaration that circumstances exist justifying the authorization of the emergency use of in vitro diagnostic tests for detection of SARS-CoV-2 virus and/or diagnosis of COVID-19 infection under section 564(b)(1) of the  Act, 21 U.S.C. 639aaa-6(a) (1), unless the authorization is terminated or revoked sooner. When diagnostic testing is negative, the possibility of a false negative result should be considered in the context of a patient's recent exposures and the presence of clinical signs and symptoms consistent with COVID-19. An individual without symptoms  of COVID-19 and who is not shedding SARS-CoV-2 virus would expect to have a negative (not detected) result in this assay.     Labs: CBC: Recent Labs  Lab 12/25/23 2118 12/25/23 2120 12/26/23 0431 12/28/23 0602 12/31/23 1549  WBC  --  10.4 8.9 7.8 8.5  HGB 11.2* 10.7* 10.2* 11.8* 11.2*  HCT 33.0* 33.3* 30.6* 36.9 35.0*  MCV  --  92.0 89.2 91.6 92.3  PLT  --  362 296 365 355   Basic Metabolic Panel: Recent Labs  Lab 12/25/23 2120 12/26/23 0431 12/27/23 1524 12/28/23 0602 12/31/23 1549  NA 134* 138 139 141 136  K 4.8 4.2 3.7 4.1 4.8  CL 95* 98 99 99 98  CO2 27 30 27 27 27   GLUCOSE 125* 105* 128* 94 108*  BUN 19 12 7* 6* 17  CREATININE 1.08* 0.87 0.99 1.01* 1.21*  CALCIUM 9.1 8.8* 8.8* 9.0 9.2  MG  --  1.3*  --   --   --    Liver Function Tests: Recent Labs  Lab 12/25/23 2120 12/26/23 0431 12/27/23 1524 12/28/23 0602 12/31/23 1549  AST 141* 125* 37 27 19  ALT 69* 82* 48* 43 26  ALKPHOS 328* 315* 252* 230* 182*  BILITOT 0.6 0.6 0.5 0.5 0.2  PROT 6.6 6.2* 6.4* 6.3* 6.8  ALBUMIN 3.3* 2.9* 3.1* 3.0* 3.4*   CBG: Recent Labs  Lab 01/01/24  0800  GLUCAP 102*    Discharge time spent: 35 minutes.  Signed: Elgin Lam, MD [facility] 01/01/2024

## 2024-01-19 ENCOUNTER — Ambulatory Visit: Payer: MEDICAID
